# Patient Record
Sex: Male | Born: 1940 | Race: White | Hispanic: No | Marital: Married | Smoking: Former smoker
Health system: Southern US, Community
[De-identification: ages and names within clinical notes are randomized; demographics above are authoritative.]

## PROBLEM LIST (undated history)

## (undated) DIAGNOSIS — I10 Essential (primary) hypertension: Secondary | ICD-10-CM

## (undated) DIAGNOSIS — E559 Vitamin D deficiency, unspecified: Secondary | ICD-10-CM

## (undated) DIAGNOSIS — E785 Hyperlipidemia, unspecified: Secondary | ICD-10-CM

## (undated) DIAGNOSIS — N400 Enlarged prostate without lower urinary tract symptoms: Secondary | ICD-10-CM

## (undated) HISTORY — DX: Benign prostatic hyperplasia without lower urinary tract symptoms: N40.0

## (undated) HISTORY — DX: Hyperlipidemia, unspecified: E78.5

## (undated) HISTORY — DX: Vitamin D deficiency, unspecified: E55.9

## (undated) HISTORY — DX: Essential (primary) hypertension: I10

---

## 1978-01-28 HISTORY — PX: ANAL FISSURE REPAIR: SHX2312

## 2007-12-03 ENCOUNTER — Encounter: Payer: Self-pay | Admitting: Gastroenterology

## 2008-06-09 ENCOUNTER — Ambulatory Visit: Payer: Self-pay | Admitting: Gastroenterology

## 2008-06-22 ENCOUNTER — Ambulatory Visit: Payer: Self-pay | Admitting: Gastroenterology

## 2008-06-22 ENCOUNTER — Encounter: Payer: Self-pay | Admitting: Gastroenterology

## 2008-06-23 ENCOUNTER — Encounter: Payer: Self-pay | Admitting: Gastroenterology

## 2010-01-28 HISTORY — PX: OTHER SURGICAL HISTORY: SHX169

## 2010-01-28 HISTORY — PX: PROSTATE SURGERY: SHX751

## 2010-05-08 LAB — GLUCOSE, CAPILLARY
Glucose-Capillary: 104 mg/dL — ABNORMAL HIGH (ref 70–99)
Glucose-Capillary: 87 mg/dL (ref 70–99)

## 2010-08-17 ENCOUNTER — Encounter (HOSPITAL_COMMUNITY): Payer: Medicare Other

## 2010-08-17 ENCOUNTER — Other Ambulatory Visit: Payer: Self-pay | Admitting: Urology

## 2010-08-17 LAB — BASIC METABOLIC PANEL
BUN: 32 mg/dL — ABNORMAL HIGH (ref 6–23)
CO2: 29 mEq/L (ref 19–32)
Calcium: 9.4 mg/dL (ref 8.4–10.5)
Chloride: 104 mEq/L (ref 96–112)
Creatinine, Ser: 1.54 mg/dL — ABNORMAL HIGH (ref 0.50–1.35)
GFR calc Af Amer: 55 mL/min — ABNORMAL LOW (ref >60)
GFR calc non Af Amer: 45 mL/min — ABNORMAL LOW (ref >60)
Glucose, Bld: 105 mg/dL — ABNORMAL HIGH (ref 70–99)
Potassium: 5.1 mEq/L (ref 3.5–5.1)
Sodium: 137 mEq/L (ref 135–145)

## 2010-08-17 LAB — CBC
HCT: 36.5 % — ABNORMAL LOW (ref 39.0–52.0)
Hemoglobin: 11.8 g/dL — ABNORMAL LOW (ref 13.0–17.0)
MCH: 32.4 pg (ref 26.0–34.0)
MCHC: 32.3 g/dL (ref 30.0–36.0)
MCV: 100.3 fL — ABNORMAL HIGH (ref 78.0–100.0)
Platelets: 262 10*3/uL (ref 150–400)
RBC: 3.64 MIL/uL — ABNORMAL LOW (ref 4.22–5.81)
RDW: 13.3 % (ref 11.5–15.5)
WBC: 7.8 10*3/uL (ref 4.0–10.5)

## 2010-08-17 LAB — SURGICAL PCR SCREEN
MRSA, PCR: POSITIVE — AB
Staphylococcus aureus: POSITIVE — AB

## 2010-08-24 ENCOUNTER — Ambulatory Visit (HOSPITAL_COMMUNITY)
Admission: RE | Admit: 2010-08-24 | Discharge: 2010-08-25 | Disposition: A | Discharge status: Home / Self Care | Payer: Medicare Other | Source: Ambulatory Visit | Attending: Urology | Admitting: Urology

## 2010-08-24 ENCOUNTER — Other Ambulatory Visit: Payer: Self-pay | Admitting: Urology

## 2010-08-24 DIAGNOSIS — N138 Other obstructive and reflux uropathy: Secondary | ICD-10-CM | POA: Insufficient documentation

## 2010-08-24 DIAGNOSIS — N401 Enlarged prostate with lower urinary tract symptoms: Secondary | ICD-10-CM | POA: Insufficient documentation

## 2010-08-24 DIAGNOSIS — Z01810 Encounter for preprocedural cardiovascular examination: Secondary | ICD-10-CM | POA: Insufficient documentation

## 2010-08-24 DIAGNOSIS — Z79899 Other long term (current) drug therapy: Secondary | ICD-10-CM | POA: Insufficient documentation

## 2010-08-24 DIAGNOSIS — E7800 Pure hypercholesterolemia, unspecified: Secondary | ICD-10-CM | POA: Insufficient documentation

## 2010-08-24 DIAGNOSIS — E119 Type 2 diabetes mellitus without complications: Secondary | ICD-10-CM | POA: Insufficient documentation

## 2010-08-24 DIAGNOSIS — Z7982 Long term (current) use of aspirin: Secondary | ICD-10-CM | POA: Insufficient documentation

## 2010-08-24 DIAGNOSIS — Z01812 Encounter for preprocedural laboratory examination: Secondary | ICD-10-CM | POA: Insufficient documentation

## 2010-08-24 DIAGNOSIS — I10 Essential (primary) hypertension: Secondary | ICD-10-CM | POA: Insufficient documentation

## 2010-08-24 LAB — GLUCOSE, CAPILLARY
Glucose-Capillary: 95 mg/dL (ref 70–99)
Glucose-Capillary: 109 mg/dL — ABNORMAL HIGH (ref 70–99)

## 2010-09-02 NOTE — Op Note (Signed)
NAME:  Mario Powers, SLIKER NO.:  [id number]  MEDICAL RECORD NO.:  [id number]  LOCATION:  DAYL                         FACILITY:  [facility]  PHYSICIAN:  Jakelin Taussig C. Vernie Ammons, M.D.  DATE OF BIRTH:  February 11, 1940  DATE OF PROCEDURE:  08/24/2010 DATE OF DISCHARGE:                              OPERATIVE REPORT   DATE OF OPERATION:  08/24/2010  PREOPERATIVE DIAGNOSIS:  Benign prostatic hypertrophy with outlet obstruction.  POSTOPERATIVE DIAGNOSIS:  Benign prostatic hypertrophy with outlet obstruction.  PROCEDURE:  Transurethral resection of the prostate.  SURGEON:  Mychelle Kendra C. Vernie Ammons, M.D.  ANESTHESIA:  General.  SPECIMENS:  Prostate chips to pathology.  ESTIMATED BLOOD LOSS:  Approximately 100 mL.  DRAINS:  A 20-French three-way Foley catheter.  COMPLICATIONS:  None.  INDICATIONS:  Patient is a 70 year old male with bladder outlet obstruction, which resulted in obstructive uropathy with an elevation of his creatinine as well as bilateral hydronephrosis.  A Foley catheter was placed and the bladder was drained overtime with resolution of his hydronephrosis and improvement of his creatinine.  He underwent urodynamics, which revealed outlet obstruction with a strong detrusor contraction and mild detrusor instability, which was felt to be of no clinical significance.  I therefore discussed the treatment options with the patient and he has elected to proceed with transurethral resection of his prostate.  The risks, complications, and alternatives have been discussed.  DESCRIPTION OF OPERATION:  After informed consent, the patient was brought to the major OR, placed on the table, administered general anesthesia then moved to the dorsal lithotomy position.  He received 500 mg of intravenous Cipro.  His genitalia was sterilely prepped and draped and an official time-out was then performed.  A 28-French resectoscope sheath with Timberlake obturator was then passed into the  bladder and the obturator was removed and replaced with the 12 degrees lens and resectoscope element.  The bladder was noted to have 4+ trabeculation.  There was some irritation of the mucosa secondary to his Foley catheter, but no worrisome lesions were found on full and systematic inspection of the bladder.  Ureteral orifices were noted to be of normal position and configuration and well away from the bladder neck.  I photographed the prostate as I withdrew the scope back, which revealed significant obstructing lateral lobes.  Resection was then begun at 6 o'clock position and I resected back from the 6 o'clock position of the bladder neck to the verumontanum.  I then resected the left lobe of the prostate in a counterclockwise fashion from 5 o'clock to 12 o'clock position resecting down to surgical capsule.  The bleeding points were cauterized as they were encountered. I then proceeded to resect the right lobe in an identical fashion.  I then resected apical tissue with care being taken to remain proximal to the verumontanum at all times.  The Microvasive evacuator was then used to remove all prostate chips from the bladder and reinspection of the bladder revealed it was intact with no chips present.  Ureteral orifices were intact as well.  Prostatic urethra was widely patent.  All bleeding points were cauterized and the resectoscope was removed.  The 20-French Foley catheter was then inserted into the  bladder and connected to close system drainage as well as continuous bladder irrigation and the patient was awakened and taken to recovery room in stable and satisfactory condition.  He tolerated the procedure well and there were no intraoperative complications.  He will be observed overnight with anticipation of catheter removal and discharged in the morning.     Dyana Magner C. Vernie Ammons, M.D.     MCO/MEDQ  D:  08/24/2010  T:  08/24/2010  Job:  [id number]  Electronically Signed by Ihor Gully M.D. on 09/02/2010 05:11:53 PM

## 2011-01-29 DEATH — deceased

## 2011-11-28 ENCOUNTER — Encounter: Payer: Self-pay | Admitting: Cardiology

## 2012-03-17 ENCOUNTER — Other Ambulatory Visit: Payer: Self-pay | Admitting: Dermatology

## 2012-04-20 ENCOUNTER — Other Ambulatory Visit: Payer: Self-pay

## 2012-04-20 MED ORDER — ATORVASTATIN CALCIUM 80 MG PO TABS: 80.0000 mg | ORAL_TABLET | Freq: Every day | ORAL | Status: DC

## 2012-04-21 ENCOUNTER — Other Ambulatory Visit: Payer: Self-pay

## 2012-04-21 MED ORDER — ATORVASTATIN CALCIUM 80 MG PO TABS: 80.0000 mg | ORAL_TABLET | Freq: Every day | ORAL | Status: DC

## 2012-05-27 ENCOUNTER — Other Ambulatory Visit: Payer: Self-pay | Admitting: Family Medicine

## 2012-06-19 ENCOUNTER — Other Ambulatory Visit (INDEPENDENT_AMBULATORY_CARE_PROVIDER_SITE_OTHER): Payer: Medicare Other

## 2012-06-19 DIAGNOSIS — Z1212 Encounter for screening for malignant neoplasm of rectum: Secondary | ICD-10-CM

## 2012-07-17 ENCOUNTER — Other Ambulatory Visit: Payer: Self-pay | Admitting: Family Medicine

## 2012-07-20 NOTE — Telephone Encounter (Signed)
LAST LABS 2/14.

## 2012-07-22 ENCOUNTER — Encounter: Payer: Self-pay | Admitting: Family Medicine

## 2012-07-22 ENCOUNTER — Ambulatory Visit (INDEPENDENT_AMBULATORY_CARE_PROVIDER_SITE_OTHER): Payer: Medicare Other | Admitting: Family Medicine

## 2012-07-22 VITALS — BP 118/65 | HR 71 | Temp 97.4°F | Ht 67.5 in | Wt 203.0 lb

## 2012-07-22 DIAGNOSIS — R972 Elevated prostate specific antigen [PSA]: Secondary | ICD-10-CM

## 2012-07-22 DIAGNOSIS — E559 Vitamin D deficiency, unspecified: Secondary | ICD-10-CM

## 2012-07-22 DIAGNOSIS — I10 Essential (primary) hypertension: Secondary | ICD-10-CM

## 2012-07-22 DIAGNOSIS — N400 Enlarged prostate without lower urinary tract symptoms: Secondary | ICD-10-CM

## 2012-07-22 DIAGNOSIS — E785 Hyperlipidemia, unspecified: Secondary | ICD-10-CM

## 2012-07-22 LAB — POCT CBC
MCH, POC: 34.5 pg — AB (ref 27–31.2)
MCV: 98.9 fL — AB (ref 80–97)
POC LYMPH PERCENT: 35.5 %L (ref 10–50)
Platelet Count, POC: 244 10*3/uL (ref 142–424)
RBC: 4.8 M/uL (ref 4.69–6.13)
RDW, POC: 13.2 %
WBC: 6.5 10*3/uL (ref 4.6–10.2)

## 2012-07-22 LAB — PSA: PSA: 2.86 ng/mL (ref <=4.00)

## 2012-07-22 LAB — HEPATIC FUNCTION PANEL
ALT: 18 U/L (ref 0–53)
Albumin: 3.8 g/dL (ref 3.5–5.2)
Alkaline Phosphatase: 77 U/L (ref 39–117)
Total Protein: 6.8 g/dL (ref 6.0–8.3)

## 2012-07-22 LAB — BASIC METABOLIC PANEL WITH GFR
BUN: 18 mg/dL (ref 6–23)
Chloride: 101 mEq/L (ref 96–112)
Creat: 1.3 mg/dL (ref 0.50–1.35)
GFR, Est Non African American: 55 mL/min — ABNORMAL LOW

## 2012-07-22 NOTE — Addendum Note (Signed)
Addended by: Gwenith Daily on: 07/22/2012 10:30 AM   Modules accepted: Orders

## 2012-07-22 NOTE — Addendum Note (Signed)
Addended by: Orma Render F on: 07/22/2012 10:39 AM   Modules accepted: Orders

## 2012-07-22 NOTE — Patient Instructions (Addendum)
Fall precautions discussed Continue current meds and therapeutic lifestyle changes May try Claritin over-the-counter and or Nasacort AQ over-the-counter 1-2 sprays each nostril daily if needed for allergic rhinitis

## 2012-07-22 NOTE — Progress Notes (Addendum)
  Subjective:    Patient ID: Mario Powers, male    DOB: [birth year], 72 y.o.   MRN: [id number]  HPI Patient returns to clinic today for followup of chronic medical problems. These include hypertension hyperlipidemia and BPH. He has been followed by Dr. Vernie Ammons for his obstructive uropathy, which has been resolved with TURP. He has had a history of elevated PSAs and subsequent biopsies have been negative. He also had an elevated creatinine but this has recently come back to a normal range subsequent to TURP. He is doing well staying active and works some in the real estate business but goes to because a lot and does a lot of sailing.   Review of Systems  Constitutional: Negative.   HENT: Positive for sneezing (due to allergies) and postnasal drip (due to allergies).   Eyes: Negative.   Cardiovascular: Negative.   Gastrointestinal: Negative.   Endocrine: Negative.   Genitourinary: Negative.   Musculoskeletal: Negative.   Skin: Negative.   Allergic/Immunologic: Positive for environmental allergies (seasonal).  Neurological: Negative.   Psychiatric/Behavioral: Negative.    There is an irritated seborrheic keratosis on his wrist.dwmf     Objective:   Physical Exam  BP 118/65  Pulse 71  Temp(Src) 97.4 F (36.3 C) (Oral)  Ht 5' 7.5" (1.715 m)  Wt 203 lb (92.08 kg)  BMI 31.31 kg/m2  The patient appeared well nourished and normally developed, alert and oriented to time and place. Speech, behavior and judgement appear normal. Vital signs as documented.  Head exam is unremarkable. No scleral icterus or pallor noted. Some nasal congestion.  Neck is without jugular venous distension, thyromegally, or carotid bruits. Carotid upstrokes are brisk bilaterally. No cervical adenopathy. Lungs are clear anteriorly and posteriorly to auscultation. Normal respiratory effort. No axillary nodes the Cardiac exam reveals regular rate and rhythm at 72 per minute. First and second heart sounds normal.   No murmurs, rubs or gallops.  Abdominal exam reveals normal bowl sounds, no masses, no organomegaly and no aortic enlargement. No inguinal adenopathy. Extremities are nonedematous and both femoral and pedal pulses are normal. Skin without pallor or jaundice.  Warm and dry, without rash. He has an irritated seborrheic keratosis on the left lateral wrist. Neurologic exam reveals normal deep tendon reflexes and normal sensation.         Assessment & Plan:  1. Hyperlipidemia - Hepatic function panel; Standing - NMR Lipoprofile with Lipids; Standing  2. Hypertension - POCT CBC; Standing - BASIC METABOLIC PANEL WITH GFR; Standing  3. BPH (benign prostatic hypertrophy) -PSA  4. Vitamin D deficiency - Vitamin D 25 hydroxy; Standing  5. Elevated PSA  6. Irritated seborrheic keratosis left lateral Wrist -Cryotherapy  Patient Instructions  Fall precautions discussed Continue current meds and therapeutic lifestyle changes May try Claritin over-the-counter and or Nasacort AQ over-the-counter 1-2 sprays each nostril daily if needed for allergic rhinitis

## 2012-07-23 LAB — NMR LIPOPROFILE WITH LIPIDS
HDL Particle Number: 35 umol/L (ref >=30.5)
LDL (calc): 68 mg/dL (ref <100)
LDL Size: 21.3 nm (ref >20.5)
LP-IR Score: 34 (ref <=45)
Large HDL-P: 5.1 umol/L (ref >=4.8)
Large VLDL-P: 1.7 nmol/L (ref <=2.7)
Small LDL Particle Number: 589 nmol/L — ABNORMAL HIGH (ref <=527)

## 2012-08-12 ENCOUNTER — Telehealth: Payer: Self-pay | Admitting: *Deleted

## 2012-08-12 NOTE — Telephone Encounter (Signed)
Message copied by Bearl Mulberry on Wed Aug 12, 2012  6:13 PM ------      Message from: Ernestina Penna      Created: Thu Jul 23, 2012  1:05 PM       PSA is 2.86      Electrolytes blood sugar and renal were all within normal limits.      All liver function tests were within normal limit      Vitamin D is excellent at 55      On advanced lipid testing the total LDL particle number is slightly elevated at 1121. The goal would be less than 1000. Triglycerides are good at 69.--- Continue current medication and aggressive therapeutic lifestyle changes to get cholesterol under better control. ------

## 2012-08-12 NOTE — Telephone Encounter (Signed)
Pt notified of results

## 2012-09-23 ENCOUNTER — Other Ambulatory Visit: Payer: Self-pay | Admitting: Family Medicine

## 2012-10-14 ENCOUNTER — Other Ambulatory Visit: Payer: Self-pay | Admitting: Family Medicine

## 2012-12-03 ENCOUNTER — Other Ambulatory Visit: Payer: Self-pay

## 2013-01-13 ENCOUNTER — Ambulatory Visit (INDEPENDENT_AMBULATORY_CARE_PROVIDER_SITE_OTHER): Payer: Medicare Other | Admitting: Family Medicine

## 2013-01-13 ENCOUNTER — Encounter: Payer: Self-pay | Admitting: Family Medicine

## 2013-01-13 VITALS — BP 122/66 | HR 66 | Temp 97.8°F | Ht 67.5 in | Wt 202.0 lb

## 2013-01-13 DIAGNOSIS — R739 Hyperglycemia, unspecified: Secondary | ICD-10-CM

## 2013-01-13 DIAGNOSIS — Z860100 Personal history of colon polyps, unspecified: Secondary | ICD-10-CM | POA: Insufficient documentation

## 2013-01-13 DIAGNOSIS — R7309 Other abnormal glucose: Secondary | ICD-10-CM

## 2013-01-13 DIAGNOSIS — E785 Hyperlipidemia, unspecified: Secondary | ICD-10-CM

## 2013-01-13 DIAGNOSIS — E559 Vitamin D deficiency, unspecified: Secondary | ICD-10-CM

## 2013-01-13 DIAGNOSIS — I10 Essential (primary) hypertension: Secondary | ICD-10-CM

## 2013-01-13 DIAGNOSIS — Z23 Encounter for immunization: Secondary | ICD-10-CM

## 2013-01-13 DIAGNOSIS — N400 Enlarged prostate without lower urinary tract symptoms: Secondary | ICD-10-CM

## 2013-01-13 DIAGNOSIS — R972 Elevated prostate specific antigen [PSA]: Secondary | ICD-10-CM

## 2013-01-13 LAB — POCT CBC
Granulocyte percent: 65.4 %G (ref 37–80)
HCT, POC: 49.3 % (ref 43.5–53.7)
Hemoglobin: 16.1 g/dL (ref 14.1–18.1)
MPV: 8.2 fL (ref 0–99.8)
POC Granulocyte: 4.4 (ref 2–6.9)
RBC: 4.9 M/uL (ref 4.69–6.13)

## 2013-01-13 NOTE — Progress Notes (Signed)
Subjective:    Patient ID: Mario Powers, male    DOB: 08-10-1940, 72 y.o.   MRN: [id number]  HPI Pt here for follow up and management of chronic medical problems.patient here today for routine followupand his annual exam. His health maintenance parameters appear to be up-to-date other than receiving the Prevnar vaccine.this patient has a past history of elevated PSAs with multiple biopsies which have been negative. The patient also has a history of an elevated creatinine in the past and this was found to be primarily deep to an enlarged prostate for which he had a TURP. He has a history of colon polyps and his colonoscopies need to be done every 5 years and this will be coming up next spring.       Patient Active Problem List   Diagnosis Date Noted  . Hyperlipidemia 07/22/2012  . Hypertension 07/22/2012  . BPH (benign prostatic hypertrophy) 07/22/2012  . Vitamin D deficiency 07/22/2012   Outpatient Encounter Prescriptions as of 01/13/2013  Medication Sig  . aspirin 81 MG tablet Take 81 mg by mouth daily.  Marland Kitchen atorvastatin (LIPITOR) 80 MG tablet Take 80 mg by mouth daily. As directed  . Cholecalciferol (VITAMIN D) 2000 UNITS CAPS Take 1 capsule by mouth daily.  . fish oil-omega-3 fatty acids 1000 MG capsule Take 2 g by mouth daily.  Marland Kitchen losartan-hydrochlorothiazide (HYZAAR) 100-12.5 MG per tablet TAKE 1 TABLET BY MOUTH ONCE A DAY as directed  . Multiple Vitamin (MULTIVITAMIN) tablet Take 1 tablet by mouth daily.  . niacin (NIASPAN) 1000 MG CR tablet TAKE 3 TABLETS BY MOUTH AT BEDTIME  . pioglitazone (ACTOS) 30 MG tablet TAKE 1 TABLET BY MOUTH EVERY DAY  . [DISCONTINUED] atorvastatin (LIPITOR) 80 MG tablet Take 1 tablet (80 mg total) by mouth daily.  . [DISCONTINUED] losartan-hydrochlorothiazide (HYZAAR) 100-12.5 MG per tablet TAKE 1 TABLET BY MOUTH ONCE A DAY    Review of Systems  Constitutional: Negative.   HENT: Negative.   Eyes: Negative.   Respiratory: Negative.     Cardiovascular: Negative.   Gastrointestinal: Negative.   Endocrine: Negative.   Genitourinary: Negative.   Musculoskeletal: Negative.   Skin: Negative.   Allergic/Immunologic: Negative.   Neurological: Negative.   Hematological: Negative.   Psychiatric/Behavioral: Negative.        Objective:   Physical Exam  Nursing note and vitals reviewed. Constitutional: He is oriented to person, place, and time. He appears well-developed and well-nourished. No distress.  HENT:  Head: Normocephalic and atraumatic.  Right Ear: External ear normal.  Left Ear: External ear normal.  Nose: Nose normal.  Mouth/Throat: Oropharynx is clear and moist. No oropharyngeal exudate.  Eyes: Conjunctivae and EOM are normal. Pupils are equal, round, and reactive to light. Right eye exhibits no discharge. Left eye exhibits no discharge. No scleral icterus.  Neck: Normal range of motion. Neck supple. No tracheal deviation present. No thyromegaly present.  No carotid bruits  Cardiovascular: Normal rate, regular rhythm, normal heart sounds and intact distal pulses.  Exam reveals no gallop and no friction rub.   No murmur heard. 72 per minute  Pulmonary/Chest: Effort normal and breath sounds normal. No respiratory distress. He has no wheezes. He has no rales. He exhibits no tenderness.  Abdominal: Soft. Bowel sounds are normal. He exhibits no mass. There is no tenderness. There is no rebound and no guarding.  Genitourinary: Rectum normal and penis normal. No penile tenderness.  Prostate was enlarged and smooth without any lumps or masses. There were no  rectal masses. External genitalia were normal. There were no inguinal nodes.  Musculoskeletal: Normal range of motion. He exhibits no edema and no tenderness.  Lymphadenopathy:    He has no cervical adenopathy.  Neurological: He is alert and oriented to person, place, and time. He has normal reflexes. No cranial nerve deficit.  Skin: Skin is warm and dry. No rash  noted. No erythema. No pallor.  Psychiatric: He has a normal mood and affect. His behavior is normal. Judgment and thought content normal.   BP 122/66  Pulse 66  Temp(Src) 97.8 F (36.6 C) (Oral)  Ht 5' 7.5" (1.715 m)  Wt 202 lb (91.627 kg)  BMI 31.15 kg/m2        Assessment & Plan:  1. Vitamin D deficiency - POCT CBC - Vit D  25 hydroxy (rtn osteoporosis monitoring)  2. Hypertension - POCT CBC - BMP8+EGFR - Hepatic function panel  3. Hyperlipidemia - POCT CBC - NMR, lipoprofile  4. BPH (benign prostatic hypertrophy) - POCT CBC - PSA, total and free  5. Elevated blood sugar - POCT glycosylated hemoglobin (Hb A1C)  6. BPH with elevated PSA  7. Personal history of colonic polyps -colonoscopy due in May 2015  8.annual exam  Meds ordered this encounter  Medications  . atorvastatin (LIPITOR) 80 MG tablet    Sig: Take 80 mg by mouth daily. As directed  . losartan-hydrochlorothiazide (HYZAAR) 100-12.5 MG per tablet    Sig: TAKE 1 TABLET BY MOUTH ONCE A DAY as directed   Patient Instructions  Continue current medications. Continue good therapeutic lifestyle changes which include good diet and exercise. Fall precautions discussed with patient. Schedule your flu vaccine if you haven't had it yet If you are over 49 years old - you may need Prevnar 13 or the adult Pneumonia vaccine. Prevnar shot that [place] Today may make your arm is sore for 2-3 days. Remember that your colonoscopy will be due again this may or June   Nyra Capes MD

## 2013-01-13 NOTE — Addendum Note (Signed)
Addended by: Magdalene River on: 01/13/2013 10:21 AM   Modules accepted: Orders

## 2013-01-13 NOTE — Patient Instructions (Addendum)
Continue current medications. Continue good therapeutic lifestyle changes which include good diet and exercise. Fall precautions discussed with patient. Schedule your flu vaccine if you haven't had it yet If you are over 71 years old - you may need Prevnar 13 or the adult Pneumonia vaccine. Prevnar shot that [place] Today may make your arm is sore for 2-3 days. Remember that your colonoscopy will be due again this may or June

## 2013-01-15 LAB — BMP8+EGFR
BUN/Creatinine Ratio: 12 (ref 10–22)
BUN: 14 mg/dL (ref 8–27)
CO2: 26 mmol/L (ref 18–29)
Calcium: 9.1 mg/dL (ref 8.6–10.2)
Chloride: 97 mmol/L (ref 97–108)
Creatinine, Ser: 1.15 mg/dL (ref 0.76–1.27)
GFR calc Af Amer: 74 mL/min/{1.73_m2} (ref >59)
GFR calc non Af Amer: 64 mL/min/{1.73_m2} (ref >59)
Glucose: 92 mg/dL (ref 65–99)
Potassium: 4.5 mmol/L (ref 3.5–5.2)
Sodium: 137 mmol/L (ref 134–144)

## 2013-01-15 LAB — NMR, LIPOPROFILE
Cholesterol: 121 mg/dL (ref <200)
HDL Cholesterol by NMR: 54 mg/dL (ref >=40)
HDL Particle Number: 32.8 umol/L (ref >=30.5)
LDL Particle Number: 637 nmol/L (ref <1000)
LDL Size: 21.3 nm (ref >20.5)
LDLC SERPL CALC-MCNC: 50 mg/dL (ref <100)
LP-IR Score: <25 (ref <=45)
Small LDL Particle Number: <90 nmol/L (ref <=527)
Triglycerides by NMR: 87 mg/dL (ref <150)

## 2013-01-15 LAB — VITAMIN D 25 HYDROXY (VIT D DEFICIENCY, FRACTURES): Vit D, 25-Hydroxy: 43 ng/mL (ref 30.0–100.0)

## 2013-01-15 LAB — PSA, TOTAL AND FREE
PSA, Free Pct: 19 %
PSA, Free: 0.95 ng/mL
PSA: 5 ng/mL — ABNORMAL HIGH (ref 0.0–4.0)

## 2013-01-15 LAB — SPECIMEN STATUS REPORT

## 2013-01-15 LAB — HEPATIC FUNCTION PANEL
ALT: 22 IU/L (ref 0–44)
AST: 27 IU/L (ref 0–40)
Albumin: 4.1 g/dL (ref 3.5–4.8)
Alkaline Phosphatase: 76 IU/L (ref 39–117)
Bilirubin, Direct: 0.3 mg/dL (ref 0.00–0.40)
Total Bilirubin: 1 mg/dL (ref 0.0–1.2)
Total Protein: 6.6 g/dL (ref 6.0–8.5)

## 2013-01-18 ENCOUNTER — Other Ambulatory Visit: Payer: Self-pay | Admitting: Family Medicine

## 2013-02-01 ENCOUNTER — Other Ambulatory Visit: Payer: Self-pay | Admitting: Family Medicine

## 2013-02-17 ENCOUNTER — Other Ambulatory Visit: Payer: Self-pay | Admitting: Dermatology

## 2013-02-23 ENCOUNTER — Encounter: Payer: Self-pay | Admitting: Family Medicine

## 2013-03-03 ENCOUNTER — Other Ambulatory Visit (INDEPENDENT_AMBULATORY_CARE_PROVIDER_SITE_OTHER): Payer: Medicare Other

## 2013-03-03 DIAGNOSIS — R7989 Other specified abnormal findings of blood chemistry: Secondary | ICD-10-CM

## 2013-03-03 LAB — POCT URINALYSIS DIPSTICK
BILIRUBIN UA: NEGATIVE
GLUCOSE UA: NEGATIVE
Ketones, UA: NEGATIVE
NITRITE UA: NEGATIVE
Spec Grav, UA: <=1.005
Urobilinogen, UA: NEGATIVE
pH, UA: 7.5

## 2013-03-03 LAB — POCT UA - MICROSCOPIC ONLY
Casts, Ur, LPF, POC: NEGATIVE
Crystals, Ur, HPF, POC: NEGATIVE
Mucus, UA: NEGATIVE
Yeast, UA: NEGATIVE

## 2013-03-03 NOTE — Progress Notes (Signed)
Patient here for labs only. 

## 2013-03-04 ENCOUNTER — Other Ambulatory Visit: Payer: Self-pay | Admitting: *Deleted

## 2013-03-04 LAB — PSA, TOTAL AND FREE
PSA FREE PCT: 18.5 %
PSA, Free: 0.76 ng/mL
PSA: 4.1 ng/mL — ABNORMAL HIGH (ref 0.0–4.0)

## 2013-03-04 MED ORDER — SULFAMETHOXAZOLE-TMP DS 800-160 MG PO TABS: 1.0000 | ORAL_TABLET | Freq: Two times a day (BID) | ORAL | Status: DC

## 2013-04-19 ENCOUNTER — Other Ambulatory Visit: Payer: Self-pay | Admitting: Family Medicine

## 2013-05-06 ENCOUNTER — Encounter: Payer: Self-pay | Admitting: Gastroenterology

## 2013-05-11 ENCOUNTER — Ambulatory Visit (INDEPENDENT_AMBULATORY_CARE_PROVIDER_SITE_OTHER): Payer: Medicare Other | Admitting: Family Medicine

## 2013-05-11 ENCOUNTER — Encounter: Payer: Self-pay | Admitting: Family Medicine

## 2013-05-11 ENCOUNTER — Ambulatory Visit (INDEPENDENT_AMBULATORY_CARE_PROVIDER_SITE_OTHER): Payer: Medicare Other

## 2013-05-11 VITALS — BP 133/79 | HR 87 | Temp 97.0°F | Ht 67.5 in | Wt 203.0 lb

## 2013-05-11 DIAGNOSIS — R739 Hyperglycemia, unspecified: Secondary | ICD-10-CM

## 2013-05-11 DIAGNOSIS — M79672 Pain in left foot: Secondary | ICD-10-CM

## 2013-05-11 DIAGNOSIS — M79609 Pain in unspecified limb: Secondary | ICD-10-CM

## 2013-05-11 DIAGNOSIS — R7309 Other abnormal glucose: Secondary | ICD-10-CM

## 2013-05-11 DIAGNOSIS — N400 Enlarged prostate without lower urinary tract symptoms: Secondary | ICD-10-CM

## 2013-05-11 DIAGNOSIS — E559 Vitamin D deficiency, unspecified: Secondary | ICD-10-CM

## 2013-05-11 DIAGNOSIS — E8881 Metabolic syndrome: Secondary | ICD-10-CM

## 2013-05-11 DIAGNOSIS — E785 Hyperlipidemia, unspecified: Secondary | ICD-10-CM

## 2013-05-11 DIAGNOSIS — N390 Urinary tract infection, site not specified: Secondary | ICD-10-CM

## 2013-05-11 LAB — POCT CBC
Granulocyte percent: 52.9 %G (ref 37–80)
HCT, POC: 44.9 % (ref 43.5–53.7)
HEMOGLOBIN: 14.7 g/dL (ref 14.1–18.1)
LYMPH, POC: 2.4 (ref 0.6–3.4)
MCH: 32.8 pg — AB (ref 27–31.2)
MCHC: 32.7 g/dL (ref 31.8–35.4)
MCV: 100 fL — AB (ref 80–97)
MPV: 7.6 fL (ref 0–99.8)
POC Granulocyte: 3.1 (ref 2–6.9)
POC LYMPH PERCENT: 40.8 %L (ref 10–50)
Platelet Count, POC: 237 10*3/uL (ref 142–424)
RBC: 4.5 M/uL — AB (ref 4.69–6.13)
RDW, POC: 13.3 %
WBC: 5.8 10*3/uL (ref 4.6–10.2)

## 2013-05-11 LAB — POCT GLYCOSYLATED HEMOGLOBIN (HGB A1C): Hemoglobin A1C: 5.2

## 2013-05-11 LAB — POCT URINALYSIS DIPSTICK
BILIRUBIN UA: NEGATIVE
Glucose, UA: NEGATIVE
Ketones, UA: NEGATIVE
NITRITE UA: NEGATIVE
Protein, UA: NEGATIVE
Spec Grav, UA: 1.01
Urobilinogen, UA: NEGATIVE
pH, UA: 7

## 2013-05-11 LAB — POCT UA - MICROSCOPIC ONLY
CASTS, UR, LPF, POC: NEGATIVE
Crystals, Ur, HPF, POC: NEGATIVE
MUCUS UA: NEGATIVE
Yeast, UA: NEGATIVE

## 2013-05-11 NOTE — Addendum Note (Signed)
Addended by: Earlene Plater on: 05/11/2013 11:03 AM   Modules accepted: Orders

## 2013-05-11 NOTE — Progress Notes (Signed)
Subjective:    Patient ID: Mario Powers, male    DOB: 1940/03/10, 73 y.o.   MRN: [id number]  HPI Pt here for follow up and management of chronic medical problems. The patient complained of some right hand numbness and this was worse at nighttime while he was taking the sulfa. He completed a sore throat this numbness went away. He also complains of some left heel pain and coincidentally at the same time some right thigh pain. He is never had problems with this before. He is due to get a colonoscopy this year in May and has only been contacted by the gastroenterologist. He will also get a followup urinalysis and PSA following the recent treatment for a prostatitis.        Patient Active Problem List   Diagnosis Date Noted  . Metabolic syndrome 63/87/5643  . Elevated blood sugar 01/13/2013  . Personal history of colonic polyps 01/13/2013  . Hyperlipidemia 07/22/2012  . Hypertension 07/22/2012  . BPH (benign prostatic hypertrophy) 07/22/2012  . Vitamin D deficiency 07/22/2012   Outpatient Encounter Prescriptions as of 05/11/2013  Medication Sig  . aspirin 81 MG tablet Take 81 mg by mouth daily.  Marland Kitchen atorvastatin (LIPITOR) 80 MG tablet Take 80 mg by mouth daily. As directed  . Cholecalciferol (VITAMIN D) 2000 UNITS CAPS Take 1 capsule by mouth daily.  . fish oil-omega-3 fatty acids 1000 MG capsule Take 2 g by mouth daily.  Marland Kitchen losartan-hydrochlorothiazide (HYZAAR) 100-12.5 MG per tablet TAKE 1 TABLET BY MOUTH ONCE A DAY as directed  . Multiple Vitamin (MULTIVITAMIN) tablet Take 1 tablet by mouth daily.  . pioglitazone (ACTOS) 30 MG tablet TAKE 1 TABLET BY MOUTH EVERY DAY  . niacin (NIASPAN) 1000 MG CR tablet TAKE 3 TABLETS BY MOUTH AT BEDTIME  . [DISCONTINUED] sulfamethoxazole-trimethoprim (BACTRIM DS) 800-160 MG per tablet Take 1 tablet by mouth 2 (two) times daily.    Review of Systems  Constitutional: Negative.   HENT: Negative.   Eyes: Negative.   Respiratory: Negative.     Cardiovascular: Negative.   Gastrointestinal: Negative.   Endocrine: Negative.   Genitourinary: Negative.   Musculoskeletal: Positive for myalgias (right upper thigh pains off/on     AND had some right hand numbness (while on Bactrim)).       Left heel pain x 3 mos  Skin: Negative.   Allergic/Immunologic: Negative.   Neurological: Negative.   Hematological: Negative.   Psychiatric/Behavioral: Negative.        Objective:   Physical Exam  Nursing note and vitals reviewed. Constitutional: He is oriented to person, place, and time. He appears well-developed and well-nourished. No distress.  Alert and cooperative  HENT:  Head: Normocephalic and atraumatic.  Right Ear: External ear normal.  Left Ear: External ear normal.  Nose: Nose normal.  Mouth/Throat: Oropharynx is clear and moist. No oropharyngeal exudate.  Eyes: Conjunctivae and EOM are normal. Pupils are equal, round, and reactive to light. Right eye exhibits no discharge. Left eye exhibits no discharge. No scleral icterus.  Neck: Normal range of motion. Neck supple. No thyromegaly present.  No carotid bruits  Cardiovascular: Normal rate, regular rhythm, normal heart sounds and intact distal pulses.  Exam reveals no friction rub.   No murmur heard. Pulmonary/Chest: Effort normal and breath sounds normal. He has no wheezes. He has no rales.  No axillary nodes  Abdominal: Soft. Bowel sounds are normal. He exhibits no distension and no mass. There is no tenderness. There is no rebound and  no guarding.  No inguinal nodes  Musculoskeletal: Normal range of motion. He exhibits no edema and no tenderness.  Tender to palpation left plantar surface left heel  Neurological: He is alert and oriented to person, place, and time. He has normal reflexes.  The patient was alert cooperative and intact mentally  Skin: Skin is warm and dry. No rash noted. No erythema.  Psychiatric: He has a normal mood and affect. His behavior is normal.  Judgment and thought content normal.   BP 133/79  Pulse 87  Temp(Src) 97 F (36.1 C) (Oral)  Ht 5' 7.5" (1.715 m)  Wt 203 lb (92.08 kg)  BMI 31.31 kg/m2  [facility] reading (PRIMARY) by  Dr.Darienne Belleau-left heel  --heel spur and calcific tendinitis of the Achilles tendon                                     Assessment & Plan:  1. BPH (benign prostatic hypertrophy) - POCT CBC - PSA, total and free - POCT UA - Microscopic Only - POCT urinalysis dipstick  2. Hyperlipidemia - POCT CBC - BMP8+EGFR - Hepatic function panel - NMR, lipoprofile  3. Vitamin D deficiency - POCT CBC - Vit D  25 hydroxy (rtn osteoporosis monitoring)  4. Elevated blood sugar - POCT CBC - POCT glycosylated hemoglobin (Hb A1C)  5. Metabolic syndrome  6. Pain of left heel - DG Foot Complete Left; Future  7. Prostatitis -Improved  Patient Instructions                       Medicare Annual Wellness Visit  [facility] and the medical providers at [facility] strive to bring you the best medical care.  In doing so we not only want to address your current medical conditions and concerns but also to detect new conditions early and prevent illness, disease and health-related problems.    Medicare offers a yearly Wellness Visit which allows our clinical staff to assess your need for preventative services including immunizations, lifestyle education, counseling to decrease risk of preventable diseases and screening for fall risk and other medical concerns.    This visit is provided free of charge (no copay) for all Medicare recipients. The clinical pharmacists at [facility] have begun to conduct these Wellness Visits which will also include a thorough review of all your medications.    As you primary medical provider recommend that you make an appointment for your Annual Wellness Visit if you have not done so already this year.  You may set up this appointment before you  leave today or you may call back ([phone]) and schedule an appointment.  Please make sure when you call that you mention that you are scheduling your Annual Wellness Visit with the clinical pharmacist so that the appointment may be made for the proper length of time.     Continue current medications. Continue good therapeutic lifestyle changes which include good diet and exercise. Fall precautions discussed with patient. If an FOBT was given today- please return it to our front desk. If you are over 37 years old - you may need Prevnar 38 or the adult Pneumonia vaccine.  Continue to drink plenty of fluids Purchase a heel cup for shoe inserts Call you with the results of the lab work once those results are available Return the FOBT   Arrie Senate MD

## 2013-05-11 NOTE — Patient Instructions (Addendum)
Medicare Annual Wellness Visit  [facility] and the medical providers at [facility] strive to bring you the best medical care.  In doing so we not only want to address your current medical conditions and concerns but also to detect new conditions early and prevent illness, disease and health-related problems.    Medicare offers a yearly Wellness Visit which allows our clinical staff to assess your need for preventative services including immunizations, lifestyle education, counseling to decrease risk of preventable diseases and screening for fall risk and other medical concerns.    This visit is provided free of charge (no copay) for all Medicare recipients. The clinical pharmacists at [facility] have begun to conduct these Wellness Visits which will also include a thorough review of all your medications.    As you primary medical provider recommend that you make an appointment for your Annual Wellness Visit if you have not done so already this year.  You may set up this appointment before you leave today or you may call back ([phone]) and schedule an appointment.  Please make sure when you call that you mention that you are scheduling your Annual Wellness Visit with the clinical pharmacist so that the appointment may be made for the proper length of time.     Continue current medications. Continue good therapeutic lifestyle changes which include good diet and exercise. Fall precautions discussed with patient. If an FOBT was given today- please return it to our front desk. If you are over 67 years old - you may need Prevnar 46 or the adult Pneumonia vaccine.  Continue to drink plenty of fluids Purchase a heel cup for shoe inserts Call you with the results of the lab work once those results are available Return the FOBT

## 2013-05-13 ENCOUNTER — Other Ambulatory Visit: Payer: Self-pay | Admitting: *Deleted

## 2013-05-13 LAB — URINE CULTURE

## 2013-05-13 MED ORDER — DOXYCYCLINE HYCLATE 100 MG PO CAPS: 100.0000 mg | ORAL_CAPSULE | Freq: Two times a day (BID) | ORAL | Status: DC

## 2013-05-14 LAB — NMR, LIPOPROFILE
Cholesterol: 130 mg/dL (ref <200)
HDL CHOLESTEROL BY NMR: 51 mg/dL (ref >=40)
HDL PARTICLE NUMBER: 32.9 umol/L (ref >=30.5)
LDL Particle Number: 783 nmol/L (ref <1000)
LDL SIZE: 21.2 nm (ref >20.5)
LDLC SERPL CALC-MCNC: 62 mg/dL (ref <100)
LP-IR SCORE: 49 — AB (ref <=45)
Small LDL Particle Number: 387 nmol/L (ref <=527)
Triglycerides by NMR: 86 mg/dL (ref <150)

## 2013-05-14 LAB — BMP8+EGFR
BUN / CREAT RATIO: 15 (ref 10–22)
BUN: 17 mg/dL (ref 8–27)
CO2: 24 mmol/L (ref 18–29)
CREATININE: 1.1 mg/dL (ref 0.76–1.27)
Calcium: 9.1 mg/dL (ref 8.6–10.2)
Chloride: 99 mmol/L (ref 97–108)
GFR calc non Af Amer: 67 mL/min/{1.73_m2} (ref >59)
GFR, EST AFRICAN AMERICAN: 78 mL/min/{1.73_m2} (ref >59)
Glucose: 101 mg/dL — ABNORMAL HIGH (ref 65–99)
Potassium: 4 mmol/L (ref 3.5–5.2)
SODIUM: 139 mmol/L (ref 134–144)

## 2013-05-14 LAB — HEPATIC FUNCTION PANEL
ALBUMIN: 4 g/dL (ref 3.5–4.8)
ALK PHOS: 71 IU/L (ref 39–117)
ALT: 23 IU/L (ref 0–44)
AST: 30 IU/L (ref 0–40)
BILIRUBIN DIRECT: 0.28 mg/dL (ref 0.00–0.40)
BILIRUBIN TOTAL: 1.2 mg/dL (ref 0.0–1.2)
Total Protein: 6.3 g/dL (ref 6.0–8.5)

## 2013-05-14 LAB — PSA, TOTAL AND FREE
PSA, Free Pct: 20.2 %
PSA, Free: 0.87 ng/mL
PSA: 4.3 ng/mL — ABNORMAL HIGH (ref 0.0–4.0)

## 2013-05-14 LAB — VITAMIN D 25 HYDROXY (VIT D DEFICIENCY, FRACTURES): Vit D, 25-Hydroxy: 43.3 ng/mL (ref 30.0–100.0)

## 2013-05-31 ENCOUNTER — Other Ambulatory Visit (INDEPENDENT_AMBULATORY_CARE_PROVIDER_SITE_OTHER): Payer: Medicare Other

## 2013-05-31 ENCOUNTER — Telehealth: Payer: Self-pay | Admitting: Family Medicine

## 2013-05-31 DIAGNOSIS — N390 Urinary tract infection, site not specified: Secondary | ICD-10-CM

## 2013-05-31 DIAGNOSIS — M7730 Calcaneal spur, unspecified foot: Secondary | ICD-10-CM

## 2013-05-31 LAB — POCT URINALYSIS DIPSTICK
BILIRUBIN UA: NEGATIVE
Glucose, UA: NEGATIVE
KETONES UA: NEGATIVE
Leukocytes, UA: NEGATIVE
Nitrite, UA: NEGATIVE
PH UA: 5
Protein, UA: NEGATIVE
Spec Grav, UA: 1.02
Urobilinogen, UA: NEGATIVE

## 2013-05-31 LAB — POCT UA - MICROSCOPIC ONLY
CASTS, UR, LPF, POC: NEGATIVE
CRYSTALS, UR, HPF, POC: NEGATIVE
Epithelial cells, urine per micros: NEGATIVE
MUCUS UA: NEGATIVE
WBC, Ur, HPF, POC: NEGATIVE
Yeast, UA: NEGATIVE

## 2013-06-01 NOTE — Telephone Encounter (Signed)
Please refer this patient to [facility] and Dr. Doran Durand--- please get appointment as soon as possible

## 2013-06-01 NOTE — Telephone Encounter (Signed)
Who would you like ref to go to? [facility] address

## 2013-06-12 ENCOUNTER — Other Ambulatory Visit: Payer: Self-pay | Admitting: Family Medicine

## 2013-06-14 ENCOUNTER — Other Ambulatory Visit: Payer: Self-pay | Admitting: Family Medicine

## 2013-07-19 ENCOUNTER — Other Ambulatory Visit: Payer: Self-pay | Admitting: Family Medicine

## 2013-09-16 ENCOUNTER — Ambulatory Visit (INDEPENDENT_AMBULATORY_CARE_PROVIDER_SITE_OTHER): Payer: Medicare Other | Admitting: Family Medicine

## 2013-09-16 ENCOUNTER — Encounter: Payer: Self-pay | Admitting: Family Medicine

## 2013-09-16 VITALS — BP 123/72 | HR 69 | Temp 97.2°F | Ht 67.5 in | Wt 200.0 lb

## 2013-09-16 DIAGNOSIS — E8881 Metabolic syndrome: Secondary | ICD-10-CM

## 2013-09-16 DIAGNOSIS — E785 Hyperlipidemia, unspecified: Secondary | ICD-10-CM

## 2013-09-16 DIAGNOSIS — R972 Elevated prostate specific antigen [PSA]: Secondary | ICD-10-CM

## 2013-09-16 DIAGNOSIS — R739 Hyperglycemia, unspecified: Secondary | ICD-10-CM

## 2013-09-16 DIAGNOSIS — L989 Disorder of the skin and subcutaneous tissue, unspecified: Secondary | ICD-10-CM

## 2013-09-16 DIAGNOSIS — I10 Essential (primary) hypertension: Secondary | ICD-10-CM

## 2013-09-16 DIAGNOSIS — N400 Enlarged prostate without lower urinary tract symptoms: Secondary | ICD-10-CM

## 2013-09-16 DIAGNOSIS — E559 Vitamin D deficiency, unspecified: Secondary | ICD-10-CM

## 2013-09-16 DIAGNOSIS — R7309 Other abnormal glucose: Secondary | ICD-10-CM

## 2013-09-16 LAB — POCT CBC
Granulocyte percent: 56.2 %G (ref 37–80)
HCT, POC: 47.6 % (ref 43.5–53.7)
Hemoglobin: 15.8 g/dL (ref 14.1–18.1)
LYMPH, POC: 2.3 (ref 0.6–3.4)
MCH: 33.5 pg — AB (ref 27–31.2)
MCHC: 33.3 g/dL (ref 31.8–35.4)
MCV: 100.9 fL — AB (ref 80–97)
MPV: 7.4 fL (ref 0–99.8)
PLATELET COUNT, POC: 250 10*3/uL (ref 142–424)
POC Granulocyte: 3.6 (ref 2–6.9)
POC LYMPH %: 35.9 % (ref 10–50)
RBC: 4.7 M/uL (ref 4.69–6.13)
RDW, POC: 13.2 %
WBC: 6.4 10*3/uL (ref 4.6–10.2)

## 2013-09-16 LAB — POCT GLYCOSYLATED HEMOGLOBIN (HGB A1C)

## 2013-09-16 NOTE — Patient Instructions (Signed)
Medicare Annual Wellness Visit  [place] and the medical providers at [facility] strive to bring you the best medical care.  In doing so we not only want to address your current medical conditions and concerns but also to detect new conditions early and prevent illness, disease and health-related problems.    Medicare offers a yearly Wellness Visit which allows our clinical staff to assess your need for preventative services including immunizations, lifestyle education, counseling to decrease risk of preventable diseases and screening for fall risk and other medical concerns.    This visit is provided free of charge (no copay) for all Medicare recipients. The clinical pharmacists at [facility] have begun to conduct these Wellness Visits which will also include a thorough review of all your medications.    As you primary medical provider recommend that you make an appointment for your Annual Wellness Visit if you have not done so already this year.  You may set up this appointment before you leave today or you may call back ([phone]) and schedule an appointment.  Please make sure when you call that you mention that you are scheduling your Annual Wellness Visit with the clinical pharmacist so that the appointment may be made for the proper length of time.     Continue current medications. Continue good therapeutic lifestyle changes which include good diet and exercise. Fall precautions discussed with patient. If an FOBT was given today- please return it to our front desk. If you are over 50 years old - you may need Prevnar 13 or the adult Pneumonia vaccine.  Flu Shots will be available at our office starting mid- September. Please call and schedule a FLU CLINIC APPOINTMENT.    

## 2013-09-16 NOTE — Progress Notes (Signed)
Subjective:    Patient ID: Mario Powers, male    DOB: October 30, 1940, 73 y.o.   MRN: [id number]  HPI Pt here for follow up and management of chronic medical problems. The patient does have a skin lesion on his left shoulder which she would like to have checked. He is also going for his colonoscopy in a couple weeks. We have had periodic problems with his PSA in this is something that we will continue to check ongoing. He would get his routine lab work today. Sizes regularly and is active without any specific complaints         Patient Active Problem List   Diagnosis Date Noted  . Metabolic syndrome 33/00/7622  . Elevated blood sugar 01/13/2013  . Personal history of colonic polyps 01/13/2013  . Hyperlipidemia 07/22/2012  . Hypertension 07/22/2012  . BPH (benign prostatic hypertrophy) 07/22/2012  . Vitamin D deficiency 07/22/2012   Outpatient Encounter Prescriptions as of 09/16/2013  Medication Sig  . aspirin 81 MG tablet Take 81 mg by mouth daily.  Marland Kitchen atorvastatin (LIPITOR) 80 MG tablet Take 80 mg by mouth daily. As directed  . Cholecalciferol (VITAMIN D) 2000 UNITS CAPS Take 1 capsule by mouth daily.  . fish oil-omega-3 fatty acids 1000 MG capsule Take 2 g by mouth daily.  Marland Kitchen losartan-hydrochlorothiazide (HYZAAR) 100-12.5 MG per tablet TAKE 1 TABLET BY MOUTH ONCE A DAY as directed  . Multiple Vitamin (MULTIVITAMIN) tablet Take 1 tablet by mouth daily.  . niacin (NIASPAN) 1000 MG CR tablet TAKE 3 TABLETS BY MOUTH AT BEDTIME  . pioglitazone (ACTOS) 30 MG tablet TAKE 1 TABLET BY MOUTH EVERY DAY  . [DISCONTINUED] atorvastatin (LIPITOR) 80 MG tablet TAKE 1 TABLET BY MOUTH DAILY.  . [DISCONTINUED] doxycycline (VIBRAMYCIN) 100 MG capsule Take 1 capsule (100 mg total) by mouth 2 (two) times daily.  . [DISCONTINUED] losartan-hydrochlorothiazide (HYZAAR) 100-12.5 MG per tablet TAKE 1 TABLET BY MOUTH ONCE A DAY    Review of Systems  Constitutional: Negative.   HENT: Negative.   Eyes:  Negative.   Respiratory: Negative.   Cardiovascular: Negative.   Gastrointestinal: Negative.   Endocrine: Negative.   Genitourinary: Negative.   Musculoskeletal: Negative.   Skin: Negative.        Check left shoulder skin lesion  Allergic/Immunologic: Negative.   Neurological: Negative.   Hematological: Negative.   Psychiatric/Behavioral: Negative.        Objective:   Physical Exam  Nursing note and vitals reviewed. Constitutional: He is oriented to person, place, and time. He appears well-developed and well-nourished. No distress.  HENT:  Head: Normocephalic and atraumatic.  Right Ear: External ear normal.  Left Ear: External ear normal.  Nose: Nose normal.  Mouth/Throat: Oropharynx is clear and moist. No oropharyngeal exudate.  Eyes: Conjunctivae and EOM are normal. Pupils are equal, round, and reactive to light. Right eye exhibits no discharge. Left eye exhibits no discharge. No scleral icterus.  Neck: Normal range of motion. Neck supple. No tracheal deviation present. No thyromegaly present.  No thyromegaly or adenopathy  Cardiovascular: Normal rate, regular rhythm, normal heart sounds and intact distal pulses.  Exam reveals no gallop and no friction rub.   No murmur heard. Regular rate and rhythm at 72 per minute  Pulmonary/Chest: Effort normal and breath sounds normal. No respiratory distress. He has no wheezes. He has no rales. He exhibits no tenderness.  Abdominal: Soft. Bowel sounds are normal. He exhibits no mass. There is no tenderness. There is no rebound and  no guarding.  Musculoskeletal: Normal range of motion. He exhibits no edema and no tenderness.  Lymphadenopathy:    He has no cervical adenopathy.  Neurological: He is alert and oriented to person, place, and time. He has normal reflexes. No cranial nerve deficit.  Skin: Skin is warm and dry. No rash noted. No erythema. No pallor.  There was a raised skin lesion on the left superior shoulder. This had a  proximal lesion were treated with cryotherapy. Patient tolerated the procedure well.  Psychiatric: He has a normal mood and affect. His behavior is normal. Judgment and thought content normal.   BP 123/72  Pulse 69  Temp(Src) 97.2 F (36.2 C) (Oral)  Ht 5' 7.5" (1.715 m)  Wt 200 lb (90.719 kg)  BMI 30.84 kg/m2        Assessment & Plan:  1. BPH (benign prostatic hypertrophy) - POCT CBC - PSA, total and free  2. Elevated blood sugar - POCT CBC - POCT glycosylated hemoglobin (Hb A1C)  3. Metabolic syndrome - POCT CBC - POCT glycosylated hemoglobin (Hb A1C)  4. Essential hypertension - POCT CBC - BMP8+EGFR - Hepatic function panel  5. Hyperlipidemia - POCT CBC - NMR, lipoprofile  6. Vitamin D deficiency - POCT CBC - Vit D  25 hydroxy (rtn osteoporosis monitoring)  7. Elevated PSA - PSA, total and free  8. Skin lesion of left upper extremity -Cryotherapy performed and instructions given to patient about care of lesion   No orders of the defined types were placed in this encounter.   Patient Instructions                       Medicare Annual Wellness Visit  [place] and the medical providers at [facility] strive to bring you the best medical care.  In doing so we not only want to address your current medical conditions and concerns but also to detect new conditions early and prevent illness, disease and health-related problems.    Medicare offers a yearly Wellness Visit which allows our clinical staff to assess your need for preventative services including immunizations, lifestyle education, counseling to decrease risk of preventable diseases and screening for fall risk and other medical concerns.    This visit is provided free of charge (no copay) for all Medicare recipients. The clinical pharmacists at [facility] have begun to conduct these Wellness Visits which will also include a thorough review of all your  medications.    As you primary medical provider recommend that you make an appointment for your Annual Wellness Visit if you have not done so already this year.  You may set up this appointment before you leave today or you may call back [phone]) and schedule an appointment.  Please make sure when you call that you mention that you are scheduling your Annual Wellness Visit with the clinical pharmacist so that the appointment may be made for the proper length of time.     Continue current medications. Continue good therapeutic lifestyle changes which include good diet and exercise. Fall precautions discussed with patient. If an FOBT was given today- please return it to our front desk. If you are over 6 years old - you may need Prevnar 59 or the adult Pneumonia vaccine.  Flu Shots will be available at our office starting mid- September. Please call and schedule a FLU CLINIC APPOINTMENT.      Arrie Senate MD

## 2013-09-17 ENCOUNTER — Ambulatory Visit (AMBULATORY_SURGERY_CENTER): Payer: Self-pay

## 2013-09-17 ENCOUNTER — Other Ambulatory Visit: Payer: Self-pay | Admitting: *Deleted

## 2013-09-17 VITALS — Ht 68.0 in | Wt 202.0 lb

## 2013-09-17 DIAGNOSIS — Z860100 Personal history of colon polyps, unspecified: Secondary | ICD-10-CM

## 2013-09-17 DIAGNOSIS — R972 Elevated prostate specific antigen [PSA]: Secondary | ICD-10-CM

## 2013-09-17 LAB — HEPATIC FUNCTION PANEL
ALT: 16 IU/L (ref 0–44)
AST: 21 IU/L (ref 0–40)
Albumin: 4.1 g/dL (ref 3.5–4.8)
Alkaline Phosphatase: 80 IU/L (ref 39–117)
Bilirubin, Direct: 0.25 mg/dL (ref 0.00–0.40)
Total Bilirubin: 1 mg/dL (ref 0.0–1.2)
Total Protein: 6.8 g/dL (ref 6.0–8.5)

## 2013-09-17 LAB — NMR, LIPOPROFILE
Cholesterol: 131 mg/dL (ref 100–199)
HDL Cholesterol by NMR: 47 mg/dL (ref >39)
HDL Particle Number: 31.5 umol/L (ref >=30.5)
LDL PARTICLE NUMBER: 880 nmol/L (ref <1000)
LDL SIZE: 21.4 nm (ref >20.5)
LDLC SERPL CALC-MCNC: 66 mg/dL (ref 0–99)
LP-IR Score: 37 (ref <=45)
Small LDL Particle Number: 434 nmol/L (ref <=527)
Triglycerides by NMR: 91 mg/dL (ref 0–149)

## 2013-09-17 LAB — PSA, TOTAL AND FREE
PSA FREE PCT: 17 %
PSA, Free: 1.58 ng/mL
PSA: 9.3 ng/mL — AB (ref 0.0–4.0)

## 2013-09-17 LAB — BMP8+EGFR
BUN/Creatinine Ratio: 14 (ref 10–22)
BUN: 19 mg/dL (ref 8–27)
CHLORIDE: 99 mmol/L (ref 97–108)
CO2: 26 mmol/L (ref 18–29)
Calcium: 9.1 mg/dL (ref 8.6–10.2)
Creatinine, Ser: 1.32 mg/dL — ABNORMAL HIGH (ref 0.76–1.27)
GFR calc Af Amer: 62 mL/min/{1.73_m2} (ref >59)
GFR calc non Af Amer: 54 mL/min/{1.73_m2} — ABNORMAL LOW (ref >59)
Glucose: 103 mg/dL — ABNORMAL HIGH (ref 65–99)
Potassium: 4.9 mmol/L (ref 3.5–5.2)
SODIUM: 139 mmol/L (ref 134–144)

## 2013-09-17 LAB — VITAMIN D 25 HYDROXY (VIT D DEFICIENCY, FRACTURES): Vit D, 25-Hydroxy: 57.8 ng/mL (ref 30.0–100.0)

## 2013-09-17 MED ORDER — MOVIPREP 100 G PO SOLR: 1.0000 | Freq: Once | ORAL | Status: DC

## 2013-09-17 MED ORDER — SULFAMETHOXAZOLE-TMP DS 800-160 MG PO TABS: 1.0000 | ORAL_TABLET | Freq: Two times a day (BID) | ORAL | Status: DC

## 2013-09-17 NOTE — Progress Notes (Signed)
No allergies to eggs or soy No home oxygen No past problems with anesthesia No diet/weight loss meds  Has email.  Emmi instructions given for colonoscopy. 

## 2013-09-28 ENCOUNTER — Encounter: Payer: Self-pay | Admitting: Gastroenterology

## 2013-10-01 ENCOUNTER — Ambulatory Visit (AMBULATORY_SURGERY_CENTER): Payer: Medicare Other | Admitting: Gastroenterology

## 2013-10-01 ENCOUNTER — Encounter: Payer: Self-pay | Admitting: Gastroenterology

## 2013-10-01 VITALS — BP 131/70 | HR 71 | Temp 96.6°F | Resp 26 | Ht 68.0 in | Wt 202.0 lb

## 2013-10-01 DIAGNOSIS — D126 Benign neoplasm of colon, unspecified: Secondary | ICD-10-CM

## 2013-10-01 DIAGNOSIS — Z86010 Personal history of colonic polyps: Secondary | ICD-10-CM

## 2013-10-01 MED ORDER — DEXTROSE 5 % IV SOLN: INTRAVENOUS | Status: DC

## 2013-10-01 MED ORDER — SODIUM CHLORIDE 0.9 % IV SOLN: 500.0000 mL | INTRAVENOUS | Status: DC

## 2013-10-01 NOTE — Op Note (Signed)
[place]  [address] [place], [id number]   COLONOSCOPY PROCEDURE REPORT  PATIENT: Mario Powers, Mario Powers  MR#: [id number] BIRTHDATE: December 31, 1940 , 73  yrs. old GENDER: Male ENDOSCOPIST: Ladene Artist, MD, [facility] PROCEDURE DATE:  10/01/2013 PROCEDURE:   Colonoscopy with snare polypectomy First Screening Colonoscopy - Avg.  risk and is 50 yrs.  old or older - No.  Prior Negative Screening - Now for repeat screening. N/A  History of Adenoma - Now for follow-up colonoscopy & has been > or = to 3 yrs.  Yes hx of adenoma.  Has been 3 or more years since last colonoscopy.  Polyps Removed Today? Yes. ASA CLASS:   Class II INDICATIONS:Patient's personal history of adenomatous colon polyps.  MEDICATIONS: MAC sedation, administered by CRNA and propofol (Diprivan) 350mg  IV DESCRIPTION OF PROCEDURE:   After the risks benefits and alternatives of the procedure were thoroughly explained, informed consent was obtained.  A digital rectal exam revealed no abnormalities of the rectum.   The LB [id number]  endoscope was introduced through the anus and advanced to the cecum, which was identified by both the appendix and ileocecal valve. No adverse events experienced.   The quality of the prep was good, using MoviPrep  The instrument was then slowly withdrawn as the colon was fully examined.  COLON FINDINGS: A sessile polyp measuring 5 mm in size was found at the hepatic flexure.  A polypectomy was performed with a cold snare.  The resection was complete and the polyp tissue was completely retrieved.   A sessile polyp measuring 7 mm in size was found in the transverse colon.  A polypectomy was performed with a cold snare.  The resection was complete and the polyp tissue was completely retrieved.   The colon was otherwise normal.  There was no diverticulosis, inflammation, polyps or cancers unless previously stated.  Retroflexed views revealed no abnormalities. The time to  cecum=minutes 0 seconds.  Withdrawal time=11 minutes 46 seconds.  The scope was withdrawn and the procedure completed. COMPLICATIONS: There were no complications.  ENDOSCOPIC IMPRESSION: 1.   Sessile polyp measuring 5 mm at the hepatic flexure; polypectomy performed with a cold snare 2.   Sessile polyp measuring 7 mm in the transverse colon; polypectomy performed with a cold snare 3.   The colon was otherwise normal  RECOMMENDATIONS: 1.  Await pathology results 2.  Repeat Colonoscopy in 5 years.  eSigned:  Ladene Artist, MD, [facility] 10/01/2013 10:37 AM

## 2013-10-01 NOTE — Patient Instructions (Signed)
YOU HAD AN ENDOSCOPIC PROCEDURE TODAY AT THE [facility]: Refer to the procedure report that was given to you for any specific questions about what was found during the examination.  If the procedure report does not answer your questions, please call your gastroenterologist to clarify.  If you requested that your care partner not be given the details of your procedure findings, then the procedure report has been included in a sealed envelope for you to review at your convenience later.  YOU SHOULD EXPECT: Some feelings of bloating in the abdomen. Passage of more gas than usual.  Walking can help get rid of the air that was put into your GI tract during the procedure and reduce the bloating. If you had a lower endoscopy (such as a colonoscopy or flexible sigmoidoscopy) you may notice spotting of blood in your stool or on the toilet paper. If you underwent a bowel prep for your procedure, then you may not have a normal bowel movement for a few days.  DIET: Your first meal following the procedure should be a light meal and then it is ok to progress to your normal diet.  A half-sandwich or bowl of soup is an example of a good first meal.  Heavy or fried foods are harder to digest and may make you feel nauseous or bloated.  Likewise meals heavy in dairy and vegetables can cause extra gas to form and this can also increase the bloating.  Drink plenty of fluids but you should avoid alcoholic beverages for 24 hours.  ACTIVITY: Your care partner should take you home directly after the procedure.  You should plan to take it easy, moving slowly for the rest of the day.  You can resume normal activity the day after the procedure however you should NOT DRIVE or use heavy machinery for 24 hours (because of the sedation medicines used during the test).    SYMPTOMS TO REPORT IMMEDIATELY: A gastroenterologist can be reached at any hour.  During normal business hours, 8:30 AM to 5:00 PM Monday through Friday,  call [phone].  After hours and on weekends, please call the GI answering service at [phone] who will take a message and have the physician on call contact you.   Following lower endoscopy (colonoscopy or flexible sigmoidoscopy):  Excessive amounts of blood in the stool  Significant tenderness or worsening of abdominal pains  Swelling of the abdomen that is new, acute  Fever of 100F or higher  FOLLOW UP: If any biopsies were taken you will be contacted by phone or by letter within the next 1-3 weeks.  Call your gastroenterologist if you have not heard about the biopsies in 3 weeks.  Our staff will call the home number listed on your records the next business day following your procedure to check on you and address any questions or concerns that you may have at that time regarding the information given to you following your procedure. This is a courtesy call and so if there is no answer at the home number and we have not heard from you through the emergency physician on call, we will assume that you have returned to your regular daily activities without incident.  SIGNATURES/CONFIDENTIALITY: You and/or your care partner have signed paperwork which will be entered into your electronic medical record.  These signatures attest to the fact that that the information above on your After Visit Summary has been reviewed and is understood.  Full responsibility of the confidentiality of this   discharge information lies with you and/or your care-partner.   Please, read the handouts given to you by your recovery room nurse. 

## 2013-10-01 NOTE — Progress Notes (Signed)
Procedure ends, to recovery, report given and VSS. 

## 2013-10-01 NOTE — Progress Notes (Signed)
Called to room to assist during endoscopic procedure.  Patient ID and intended procedure confirmed with present staff. Received instructions for my participation in the procedure from the performing physician.  

## 2013-10-05 ENCOUNTER — Telehealth: Payer: Self-pay | Admitting: *Deleted

## 2013-10-05 NOTE — Telephone Encounter (Signed)
  Follow up Call-  Call back number 10/01/2013  Post procedure Call Back phone  # [phone]  Permission to leave phone message Yes     Patient questions:  Do you have a fever, pain , or abdominal swelling? No. Pain Score  0 *  Have you tolerated food without any problems? Yes.    Have you been able to return to your normal activities? Yes.    Do you have any questions about your discharge instructions: Diet   No. Medications  No. Follow up visit  No.  Do you have questions or concerns about your Care? No.  Actions: * If pain score is 4 or above: No action needed, pain <4.

## 2013-10-06 ENCOUNTER — Other Ambulatory Visit: Payer: Self-pay | Admitting: Family Medicine

## 2013-10-10 ENCOUNTER — Encounter: Payer: Self-pay | Admitting: Gastroenterology

## 2013-10-21 ENCOUNTER — Other Ambulatory Visit (INDEPENDENT_AMBULATORY_CARE_PROVIDER_SITE_OTHER): Payer: Medicare Other

## 2013-10-21 DIAGNOSIS — R972 Elevated prostate specific antigen [PSA]: Secondary | ICD-10-CM

## 2013-10-22 ENCOUNTER — Other Ambulatory Visit: Payer: Self-pay | Admitting: Pharmacist

## 2013-10-22 ENCOUNTER — Telehealth: Payer: Self-pay | Admitting: *Deleted

## 2013-10-22 LAB — PSA, TOTAL AND FREE
PSA FREE: 0.56 ng/mL
PSA, Free Pct: 17 %
PSA: 3.3 ng/mL (ref 0.0–4.0)

## 2013-10-22 NOTE — Progress Notes (Signed)
Patient was contact by Leim Fabry, Pharm D with [facility].  Per her report he is only taking atorvastatin 80mg  1/2 tablet daily for several months.  Last lipid panel shows at goals with current dose.  He is also taking losartan HCT 100/12.5mg  1/2 tablet daily.  BP has been well controlled on this dose. Updated med list to reflect how he is currently taking medication.

## 2013-10-22 NOTE — Telephone Encounter (Signed)
Pt notified of lab results Verbalizes understanding 

## 2013-12-09 ENCOUNTER — Encounter: Payer: Self-pay | Admitting: Family Medicine

## 2013-12-19 ENCOUNTER — Other Ambulatory Visit: Payer: Self-pay | Admitting: Family Medicine

## 2013-12-20 NOTE — Telephone Encounter (Signed)
Refill per protocol, next appt is in December.

## 2014-01-06 ENCOUNTER — Other Ambulatory Visit: Payer: Self-pay | Admitting: Family Medicine

## 2014-01-19 ENCOUNTER — Ambulatory Visit (INDEPENDENT_AMBULATORY_CARE_PROVIDER_SITE_OTHER): Payer: Medicare Other | Admitting: Family Medicine

## 2014-01-19 ENCOUNTER — Encounter: Payer: Self-pay | Admitting: Family Medicine

## 2014-01-19 VITALS — BP 130/75 | HR 82 | Temp 96.5°F | Ht 68.0 in | Wt 203.0 lb

## 2014-01-19 DIAGNOSIS — R972 Elevated prostate specific antigen [PSA]: Secondary | ICD-10-CM

## 2014-01-19 DIAGNOSIS — E559 Vitamin D deficiency, unspecified: Secondary | ICD-10-CM

## 2014-01-19 DIAGNOSIS — E785 Hyperlipidemia, unspecified: Secondary | ICD-10-CM

## 2014-01-19 DIAGNOSIS — N400 Enlarged prostate without lower urinary tract symptoms: Secondary | ICD-10-CM

## 2014-01-19 DIAGNOSIS — I10 Essential (primary) hypertension: Secondary | ICD-10-CM

## 2014-01-19 LAB — POCT CBC
Granulocyte percent: 52 %G (ref 37–80)
HCT, POC: 45 % (ref 43.5–53.7)
Hemoglobin: 14.4 g/dL (ref 14.1–18.1)
Lymph, poc: 2.8 (ref 0.6–3.4)
MCH, POC: 32.5 pg — AB (ref 27–31.2)
MCHC: 32 g/dL (ref 31.8–35.4)
MCV: 101.6 fL — AB (ref 80–97)
MPV: 7.3 fL (ref 0–99.8)
PLATELET COUNT, POC: 275 10*3/uL (ref 142–424)
POC Granulocyte: 3.5 (ref 2–6.9)
POC LYMPH %: 41 % (ref 10–50)
RBC: 4.4 M/uL — AB (ref 4.69–6.13)
RDW, POC: 13 %
WBC: 6.8 10*3/uL (ref 4.6–10.2)

## 2014-01-19 NOTE — Progress Notes (Signed)
Subjective:    Patient ID: Mario Powers, male    DOB: [birth year], 73 y.o.   MRN: [id number]  HPI Pt here for follow up and management of chronic medical problems. The patient's significant history is that he has had periodic elevations of his PSA. He is due to get a prostate exam today and we'll get his routine lab work today.         Patient Active Problem List   Diagnosis Date Noted  . Metabolic syndrome 35/00/9381  . Elevated blood sugar 01/13/2013  . Personal history of colonic polyps 01/13/2013  . Hyperlipidemia 07/22/2012  . Hypertension 07/22/2012  . BPH (benign prostatic hypertrophy) 07/22/2012  . Vitamin D deficiency 07/22/2012   Outpatient Encounter Prescriptions as of 01/19/2014  Medication Sig  . aspirin 81 MG tablet Take 81 mg by mouth daily.  Marland Kitchen atorvastatin (LIPITOR) 80 MG tablet Take 0.5 tablets (40 mg total) by mouth daily. As directed  . Cholecalciferol (VITAMIN D) 2000 UNITS CAPS Take 1 capsule by mouth daily.  . fish oil-omega-3 fatty acids 1000 MG capsule Take 2 g by mouth daily.  Marland Kitchen losartan-hydrochlorothiazide (HYZAAR) 100-12.5 MG per tablet TAKE 1 TABLET BY MOUTH ONCE A DAY  . Multiple Vitamin (MULTIVITAMIN) tablet Take 1 tablet by mouth daily.  . niacin (NIASPAN) 1000 MG CR tablet TAKE 3 TABLETS BY MOUTH AT BEDTIME  . pioglitazone (ACTOS) 30 MG tablet TAKE 1 TABLET BY MOUTH EVERY DAY  . [DISCONTINUED] losartan-hydrochlorothiazide (HYZAAR) 100-12.5 MG per tablet Take 0.5 tablets by mouth daily.  . [DISCONTINUED] sulfamethoxazole-trimethoprim (BACTRIM DS) 800-160 MG per tablet Take 1 tablet by mouth 2 (two) times daily.    Review of Systems  Constitutional: Negative.   HENT: Negative.   Eyes: Negative.   Respiratory: Negative.   Cardiovascular: Negative.   Gastrointestinal: Negative.   Endocrine: Negative.   Genitourinary: Negative.   Musculoskeletal: Negative.   Skin: Negative.   Allergic/Immunologic: Negative.   Neurological: Negative.     Hematological: Negative.   Psychiatric/Behavioral: Negative.        Objective:   Physical Exam  Constitutional: He is oriented to person, place, and time. He appears well-developed and well-nourished. No distress.  The patient is pleasant and cooperative and alert.  HENT:  Head: Normocephalic and atraumatic.  Right Ear: External ear normal.  Left Ear: External ear normal.  Nose: Nose normal.  Mouth/Throat: Oropharynx is clear and moist. No oropharyngeal exudate.  Eyes: Conjunctivae and EOM are normal. Pupils are equal, round, and reactive to light. Right eye exhibits no discharge. Left eye exhibits no discharge. No scleral icterus.  Neck: Normal range of motion. Neck supple. No thyromegaly present.  There is no thyromegaly or anterior cervical nodes. There are no carotid bruits.  Cardiovascular: Normal rate, regular rhythm, normal heart sounds and intact distal pulses.  Exam reveals no gallop and no friction rub.   No murmur heard. The heart has a regular rate and rhythm at 72/m.  Pulmonary/Chest: Effort normal and breath sounds normal. No respiratory distress. He has no wheezes. He has no rales.  There is no axillary adenopathy  Abdominal: Soft. Bowel sounds are normal. He exhibits no mass. There is no tenderness. There is no rebound and no guarding.  There is no inguinal adenopathy. There is no organomegaly or abdominal tenderness or masses. The patient recently had a colonoscopy and polyps were found and a repeat colonoscopy is planned in 5 years.  Genitourinary: Rectum normal and penis normal.  The prostate is  enlarged, but smooth. There are no rectal masses. The external genitalia were within normal limits without hernias or masses.  Musculoskeletal: Normal range of motion. He exhibits no edema or tenderness.  Lymphadenopathy:    He has no cervical adenopathy.  Neurological: He is alert and oriented to person, place, and time. He has normal reflexes. No cranial nerve deficit.   Skin: Skin is warm and dry. No rash noted.  Psychiatric: He has a normal mood and affect. His behavior is normal. Judgment and thought content normal.  Nursing note and vitals reviewed.  BP 130/75 mmHg  Pulse 82  Temp(Src) 96.5 F (35.8 C) (Oral)  Ht $R'5\' 8"'rd$  (1.727 m)  Wt 203 lb (92.08 kg)  BMI 30.87 kg/m2        Assessment & Plan:  1. BPH (benign prostatic hypertrophy) - POCT CBC  2. Essential hypertension - POCT CBC - BMP8+EGFR - Hepatic function panel  3. Hyperlipidemia - POCT CBC - NMR, lipoprofile  4. Vitamin D deficiency - POCT CBC - Vit D  25 hydroxy (rtn osteoporosis monitoring)  5. Elevated PSA - POCT CBC - PSA, total and free  Patient Instructions                       Medicare Annual Wellness Visit  [facility] and the medical providers at [facility] strive to bring you the best medical care.  In doing so we not only want to address your current medical conditions and concerns but also to detect new conditions early and prevent illness, disease and health-related problems.    Medicare offers a yearly Wellness Visit which allows our clinical staff to assess your need for preventative services including immunizations, lifestyle education, counseling to decrease risk of preventable diseases and screening for fall risk and other medical concerns.    This visit is provided free of charge (no copay) for all Medicare recipients. The clinical pharmacists at [facility] have begun to conduct these Wellness Visits which will also include a thorough review of all your medications.    As you primary medical provider recommend that you make an appointment for your Annual Wellness Visit if you have not done so already this year.  You may set up this appointment before you leave today or you may call back ([phone]) and schedule an appointment.  Please make sure when you call that you mention that you are scheduling your Annual  Wellness Visit with the clinical pharmacist so that the appointment may be made for the proper length of time.     Continue current medications. Continue good therapeutic lifestyle changes which include good diet and exercise. Fall precautions discussed with patient. If an FOBT was given today- please return it to our front desk. If you are over 25 years old - you may need Prevnar 39 or the adult Pneumonia vaccine.  Flu Shots will be available at our office starting mid- September. Please call and schedule a FLU CLINIC APPOINTMENT.   Continue to exercise regularly and always drink plenty of fluids. We will call you with the results of your lab work is sent as those results become available Monitor blood pressures outside of the office when possible.   Arrie Senate MD

## 2014-01-19 NOTE — Patient Instructions (Addendum)
Medicare Annual Wellness Visit  [facility] and the medical providers at [facility] strive to bring you the best medical care.  In doing so we not only want to address your current medical conditions and concerns but also to detect new conditions early and prevent illness, disease and health-related problems.    Medicare offers a yearly Wellness Visit which allows our clinical staff to assess your need for preventative services including immunizations, lifestyle education, counseling to decrease risk of preventable diseases and screening for fall risk and other medical concerns.    This visit is provided free of charge (no copay) for all Medicare recipients. The clinical pharmacists at [facility] have begun to conduct these Wellness Visits which will also include a thorough review of all your medications.    As you primary medical provider recommend that you make an appointment for your Annual Wellness Visit if you have not done so already this year.  You may set up this appointment before you leave today or you may call back [phone]) and schedule an appointment.  Please make sure when you call that you mention that you are scheduling your Annual Wellness Visit with the clinical pharmacist so that the appointment may be made for the proper length of time.     Continue current medications. Continue good therapeutic lifestyle changes which include good diet and exercise. Fall precautions discussed with patient. If an FOBT was given today- please return it to our front desk. If you are over 38 years old - you may need Prevnar 22 or the adult Pneumonia vaccine.  Flu Shots will be available at our office starting mid- September. Please call and schedule a FLU CLINIC APPOINTMENT.   Continue to exercise regularly and always drink plenty of fluids. We will call you with the results of your lab work is sent as those results become  available Monitor blood pressures outside of the office when possible.

## 2014-01-20 LAB — HEPATIC FUNCTION PANEL
ALT: 24 IU/L (ref 0–44)
AST: 30 IU/L (ref 0–40)
Albumin: 4 g/dL (ref 3.5–4.8)
Alkaline Phosphatase: 121 IU/L — ABNORMAL HIGH (ref 39–117)
Bilirubin, Direct: 0.27 mg/dL (ref 0.00–0.40)
Total Bilirubin: 1 mg/dL (ref 0.0–1.2)
Total Protein: 6.6 g/dL (ref 6.0–8.5)

## 2014-01-20 LAB — BMP8+EGFR
BUN / CREAT RATIO: 19 (ref 10–22)
BUN: 22 mg/dL (ref 8–27)
CHLORIDE: 102 mmol/L (ref 97–108)
CO2: 27 mmol/L (ref 18–29)
Calcium: 9.6 mg/dL (ref 8.6–10.2)
Creatinine, Ser: 1.18 mg/dL (ref 0.76–1.27)
GFR calc Af Amer: 71 mL/min/{1.73_m2} (ref >59)
GFR calc non Af Amer: 61 mL/min/{1.73_m2} (ref >59)
Glucose: 101 mg/dL — ABNORMAL HIGH (ref 65–99)
POTASSIUM: 4.8 mmol/L (ref 3.5–5.2)
Sodium: 141 mmol/L (ref 134–144)

## 2014-01-20 LAB — NMR, LIPOPROFILE
Cholesterol: 127 mg/dL (ref 100–199)
HDL CHOLESTEROL BY NMR: 49 mg/dL (ref >39)
HDL Particle Number: 31.6 umol/L (ref >=30.5)
LDL Particle Number: 860 nmol/L (ref <1000)
LDL Size: 21.4 nm (ref >20.5)
LDL-C: 65 mg/dL (ref 0–99)
LP-IR Score: 57 — ABNORMAL HIGH (ref <=45)
SMALL LDL PARTICLE NUMBER: 378 nmol/L (ref <=527)
Triglycerides by NMR: 65 mg/dL (ref 0–149)

## 2014-01-20 LAB — PSA, TOTAL AND FREE
PSA FREE PCT: 14.7 %
PSA, Free: 0.63 ng/mL
PSA: 4.3 ng/mL — AB (ref 0.0–4.0)

## 2014-01-20 LAB — VITAMIN D 25 HYDROXY (VIT D DEFICIENCY, FRACTURES): Vit D, 25-Hydroxy: 49.6 ng/mL (ref 30.0–100.0)

## 2014-01-31 ENCOUNTER — Other Ambulatory Visit: Payer: Self-pay | Admitting: Family Medicine

## 2014-03-01 ENCOUNTER — Other Ambulatory Visit (INDEPENDENT_AMBULATORY_CARE_PROVIDER_SITE_OTHER): Payer: Medicare Other

## 2014-03-01 DIAGNOSIS — R972 Elevated prostate specific antigen [PSA]: Secondary | ICD-10-CM

## 2014-03-01 NOTE — Progress Notes (Signed)
Lab only for PSA

## 2014-03-02 LAB — PSA, TOTAL AND FREE
PSA, Free Pct: 17.6 %
PSA, Free: 0.58 ng/mL
PSA: 3.3 ng/mL (ref 0.0–4.0)

## 2014-04-02 ENCOUNTER — Other Ambulatory Visit: Payer: Self-pay | Admitting: Family Medicine

## 2014-04-12 ENCOUNTER — Other Ambulatory Visit: Payer: Self-pay | Admitting: Dermatology

## 2014-04-12 DIAGNOSIS — D0461 Carcinoma in situ of skin of right upper limb, including shoulder: Secondary | ICD-10-CM | POA: Diagnosis not present

## 2014-04-12 DIAGNOSIS — D0460 Carcinoma in situ of skin of unspecified upper limb, including shoulder: Secondary | ICD-10-CM | POA: Diagnosis not present

## 2014-04-12 DIAGNOSIS — L570 Actinic keratosis: Secondary | ICD-10-CM | POA: Diagnosis not present

## 2014-04-13 ENCOUNTER — Other Ambulatory Visit: Payer: Self-pay | Admitting: Family Medicine

## 2014-04-29 HISTORY — PX: SKIN CANCER EXCISION: SHX779

## 2014-05-19 DIAGNOSIS — D0460 Carcinoma in situ of skin of unspecified upper limb, including shoulder: Secondary | ICD-10-CM | POA: Diagnosis not present

## 2014-05-19 DIAGNOSIS — L820 Inflamed seborrheic keratosis: Secondary | ICD-10-CM | POA: Diagnosis not present

## 2014-05-19 DIAGNOSIS — D485 Neoplasm of uncertain behavior of skin: Secondary | ICD-10-CM | POA: Diagnosis not present

## 2014-05-31 ENCOUNTER — Encounter: Payer: Self-pay | Admitting: *Deleted

## 2014-05-31 ENCOUNTER — Ambulatory Visit (INDEPENDENT_AMBULATORY_CARE_PROVIDER_SITE_OTHER): Payer: Medicare Other | Admitting: *Deleted

## 2014-05-31 VITALS — BP 126/77 | HR 70 | Resp 20 | Ht 68.0 in | Wt 208.0 lb

## 2014-05-31 DIAGNOSIS — Z0000 Encounter for general adult medical examination without abnormal findings: Secondary | ICD-10-CM

## 2014-05-31 DIAGNOSIS — Z23 Encounter for immunization: Secondary | ICD-10-CM

## 2014-05-31 NOTE — Progress Notes (Signed)
Subjective:   Mario Powers is a 73 y.o. male who presents for an Initial Medicare Annual Wellness Visit. Mario Powers is retired from [employer]. He is married and has 2 children. He reports that he had 2 skin cancer excisions a couple of weeks ago by Dr. Marge Duncans on is right and left forearms. They appear to be healing well. [place]  Review of Systems   Cardiac Risk Factors include: advanced age (>22men, >25 women);dyslipidemia;hypertension;male gender;smoking/ tobacco exposure    Objective:    Today's Vitals   05/31/14 1110  BP: 126/77  Pulse: 70  Resp: 20  Height: 5\' 8"  (1.727 m)  Weight: 208 lb (94.348 kg)    Current Medications (verified) Outpatient Encounter Prescriptions as of 05/31/2014  Medication Sig  . aspirin 81 MG tablet Take 81 mg by mouth daily.  Marland Kitchen atorvastatin (LIPITOR) 80 MG tablet Take 0.5 tablets (40 mg total) by mouth daily. As directed  . Cholecalciferol (VITAMIN D) 2000 UNITS CAPS Take 1 capsule by mouth daily.  . fish oil-omega-3 fatty acids 1000 MG capsule Take 2 g by mouth daily.  Marland Kitchen losartan-hydrochlorothiazide (HYZAAR) 100-12.5 MG per tablet TAKE 1 TABLET BY MOUTH ONCE A DAY  . Multiple Vitamin (MULTIVITAMIN) tablet Take 1 tablet by mouth daily.  . niacin (NIASPAN) 1000 MG CR tablet TAKE 3 TABLETS BY MOUTH AT BEDTIME  . pioglitazone (ACTOS) 30 MG tablet TAKE 1 TABLET BY MOUTH EVERY DAY  . atorvastatin (LIPITOR) 80 MG tablet TAKE 1 TABLET BY MOUTH DAILY.   No facility-administered encounter medications on file as of 05/31/2014.    Allergies (verified) Review of patient's allergies indicates no known allergies.   History: Past Medical History  Diagnosis Date  . Hyperlipidemia   . Hypertension   . Vitamin D deficiency   . BPH (benign prostatic hyperplasia)    Past Surgical History  Procedure Laterality Date  . Anal fissure repair N/A 1980  . Turp  2012  . Prostate  surgery  2012    He has had a TURP and multiple prostate biopsies   Family History  Problem Relation Age of Onset  . Colon cancer Neg Hx   . Pancreatic cancer Neg Hx   . Rectal cancer Neg Hx   . Stomach cancer Neg Hx   . Dementia Mother   . Irregular heart beat Mother   . Aneurysm Mother   . Hypertension Father   . Transient ischemic attack Father    Social History   Occupational History  . Retired     Programme researcher, broadcasting/film/video   Social History Main Topics  . Smoking status: Former Smoker -- 3.00 packs/day for 25 years    Types: Cigarettes    Quit date: 01/28/1986  . Smokeless tobacco: Never Used  . Alcohol Use: 1.2 oz/week    1 Glasses of wine, 1 Shots of liquor per week     Comment: a few beers a week  . Drug Use: No  . Sexual Activity:    Partners: Female   Tobacco Counseling NA   Activities of Daily Living In your present state of health, do you have any difficulty performing the following activities: 05/31/2014  Hearing? N  Vision? N  Difficulty concentrating or making decisions? N  Walking or climbing stairs? N  Dressing or bathing? N  Doing errands, shopping? N  Preparing Food and eating ? N  Using the  Toilet? N  In the past six months, have you accidently leaked urine? N  Do you have problems with loss of bowel control? N  Managing your Medications? N  Managing your Finances? N  Housekeeping or managing your Housekeeping? N    Immunizations and Health Maintenance Immunization History  Administered Date(s) Administered  . Influenza-Unspecified 10/28/2012, 11/16/2013  . Pneumococcal Conjugate-13 01/13/2013  . Pneumococcal Polysaccharide-23 08/29/2007  . Tdap 05/31/2014  . Zoster 11/28/2005   There are no preventive care reminders to display for this patient.  Patient Care Team: Chipper Herb, MD as PCP - General (Family Medicine)     Assessment:   This is a routine wellness examination for Mario Powers.   Hearing/Vision screen No hearing or vision  deficits noted. He does wear glasses and is due for his routine eye exam later this fall with [facility] in [place] and will call for an appointment when time.  Dietary issues and exercise activities discussed: Current Exercise Habits:: Home exercise routine, Type of exercise: walking, Time (Minutes): 60, Frequency (Times/Week): 6, Weekly Exercise (Minutes/Week): 360, Intensity: Moderate  Goals    None     Depression Screen PHQ 2/9 Scores 05/31/2014 01/19/2014 05/11/2013  PHQ - 2 Score 0 0 0    Fall Risk Fall Risk  05/31/2014 01/19/2014 05/11/2013 07/22/2012  Falls in the past year? No No No No  Risk for fall due to : - - Other (Comment) -  Risk for fall due to (comments): - - age related -    Cognitive Function: MMSE - Mini Mental State Exam 05/31/2014  Orientation to time 5  Orientation to Place 5  Registration 3  Attention/ Calculation 5  Recall 3  Language- name 2 objects 2  Language- repeat 1  Language- follow 3 step command 3  Language- read & follow direction 1  Write a sentence 1  Copy design 1  Total score 30    Screening Tests Health Maintenance  Topic Date Due  . INFLUENZA VACCINE  08/29/2014  . COLONOSCOPY  10/02/2018  . TETANUS/TDAP  05/30/2024  . ZOSTAVAX  Completed  . PNA vac Low Risk Adult  Completed        Plan:  Continue therapeutic lifestyle changes which include good diet and exercise  Continue current medication as ordered Continue to monitor BP at home and record and bring readings to next office visit Continue to walk 2 miles per day 6 days per week. Keep follow up appointment with Dr. Laurance Flatten on 06/01/14   During the course of the visit Mario Powers was educated and counseled about the following appropriate screening and preventive services:   Vaccines to include Pneumoccal, Influenza,Tdap, Zostavax : all up to date except Tdap and it was given today in the office  Electrocardiogram last performed 07/30/2010  Colorectal cancer screening : Colonoscopy  performed 10/01/13 per Dr. Fuller Plan hx of polyps and due every 5 years. FOBT given to patient and he will return to the office   Cardiovascular disease screening last lipids at goal on 01/19/14   Diabetes screening last HbgA1C on 09/16/13 was 5.5%   Glaucoma screening eye exam due later this fall and patient will call for an appointment   Nutrition counseling DASH diet reccommended   Prostate cancer screening PSA's are followed by Dr. Laurance Flatten BPH and  history of TURP in 2012 last PSA 03/01/14 was 3.3  Smoking cessation counseling NA  Patient Instructions (the written plan) were given to the patient.   Mario Powers, Mario Freestone, RN  05/31/2014       I have reviewed and agree with the above AWV documentation.  Claretta Fraise, M.D.

## 2014-05-31 NOTE — Patient Instructions (Addendum)
Preventive Care for Adults A healthy lifestyle and preventive care can promote health and wellness. Preventive health guidelines for men include the following key practices:  A routine yearly physical is a good way to check with your health care provider about your health and preventative screening. It is a chance to share any concerns and updates on your health and to receive a thorough exam.  Visit your dentist for a routine exam and preventative care every 6 months. Brush your teeth twice a day and floss once a day. Good oral hygiene prevents tooth decay and gum disease.  The frequency of eye exams is based on your age, health, family medical history, use of contact lenses, and other factors. Follow your health care provider's recommendations for frequency of eye exams.  Eat a healthy diet. Foods such as vegetables, fruits, whole grains, low-fat dairy products, and lean protein foods contain the nutrients you need without too many calories. Decrease your intake of foods high in solid fats, added sugars, and salt. Eat the right amount of calories for you.Get information about a proper diet from your health care provider, if necessary.  Regular physical exercise is one of the most important things you can do for your health. Most adults should get at least 150 minutes of moderate-intensity exercise (any activity that increases your heart rate and causes you to sweat) each week. In addition, most adults need muscle-strengthening exercises on 2 or more days a week.  Maintain a healthy weight. The body mass index (BMI) is a screening tool to identify possible weight problems. It provides an estimate of body fat based on height and weight. Your health care provider can find your BMI and can help you achieve or maintain a healthy weight.For adults 20 years and older:  A BMI below 18.5 is considered underweight.  A BMI of 18.5 to 24.9 is normal.  A BMI of 25 to 29.9 is considered overweight.  A BMI  of 30 and above is considered obese.  Maintain normal blood lipids and cholesterol levels by exercising and minimizing your intake of saturated fat. Eat a balanced diet with plenty of fruit and vegetables. Blood tests for lipids and cholesterol should begin at age 50 and be repeated every 5 years. If your lipid or cholesterol levels are high, you are over 50, or you are at high risk for heart disease, you may need your cholesterol levels checked more frequently.Ongoing high lipid and cholesterol levels should be treated with medicines if diet and exercise are not working.  If you smoke, find out from your health care provider how to quit. If you do not use tobacco, do not start.  Lung cancer screening is recommended for adults aged 73-80 years who are at high risk for developing lung cancer because of a history of smoking. A yearly low-dose CT scan of the lungs is recommended for people who have at least a 30-pack-year history of smoking and are a current smoker or have quit within the past 15 years. A pack year of smoking is smoking an average of 1 pack of cigarettes a day for 1 year (for example: 1 pack a day for 30 years or 2 packs a day for 15 years). Yearly screening should continue until the smoker has stopped smoking for at least 15 years. Yearly screening should be stopped for people who develop a health problem that would prevent them from having lung cancer treatment.  If you choose to drink alcohol, do not have more than  2 drinks per day. One drink is considered to be 12 ounces (355 mL) of beer, 5 ounces (148 mL) of wine, or 1.5 ounces (44 mL) of liquor.  Avoid use of street drugs. Do not share needles with anyone. Ask for help if you need support or instructions about stopping the use of drugs.  High blood pressure causes heart disease and increases the risk of stroke. Your blood pressure should be checked at least every 1-2 years. Ongoing high blood pressure should be treated with  medicines, if weight loss and exercise are not effective.  If you are 45-79 years old, ask your health care provider if you should take aspirin to prevent heart disease.  Diabetes screening involves taking a blood sample to check your fasting blood sugar level. This should be done once every 3 years, after age 45, if you are within normal weight and without risk factors for diabetes. Testing should be considered at a younger age or be carried out more frequently if you are overweight and have at least 1 risk factor for diabetes.  Colorectal cancer can be detected and often prevented. Most routine colorectal cancer screening begins at the age of 50 and continues through age 75. However, your health care provider may recommend screening at an earlier age if you have risk factors for colon cancer. On a yearly basis, your health care provider may provide home test kits to check for hidden blood in the stool. Use of a small camera at the end of a tube to directly examine the colon (sigmoidoscopy or colonoscopy) can detect the earliest forms of colorectal cancer. Talk to your health care provider about this at age 50, when routine screening begins. Direct exam of the colon should be repeated every 5-10 years through age 75, unless early forms of precancerous polyps or small growths are found.  People who are at an increased risk for hepatitis B should be screened for this virus. You are considered at high risk for hepatitis B if:  You were born in a country where hepatitis B occurs often. Talk with your health care provider about which countries are considered high risk.  Your parents were born in a high-risk country and you have not received a shot to protect against hepatitis B (hepatitis B vaccine).  You have HIV or AIDS.  You use needles to inject street drugs.  You live with, or have sex with, someone who has hepatitis B.  You are a man who has sex with other men (MSM).  You get hemodialysis  treatment.  You take certain medicines for conditions such as cancer, organ transplantation, and autoimmune conditions.  Hepatitis C blood testing is recommended for all people born from 1945 through 1965 and any individual with known risks for hepatitis C.  Practice safe sex. Use condoms and avoid high-risk sexual practices to reduce the spread of sexually transmitted infections (STIs). STIs include gonorrhea, chlamydia, syphilis, trichomonas, herpes, HPV, and human immunodeficiency virus (HIV). Herpes, HIV, and HPV are viral illnesses that have no cure. They can result in disability, cancer, and death.  If you are at risk of being infected with HIV, it is recommended that you take a prescription medicine daily to prevent HIV infection. This is called preexposure prophylaxis (PrEP). You are considered at risk if:  You are a man who has sex with other men (MSM) and have other risk factors.  You are a heterosexual man, are sexually active, and are at increased risk for HIV infection.    You take drugs by injection.  You are sexually active with a partner who has HIV.  Talk with your health care provider about whether you are at high risk of being infected with HIV. If you choose to begin PrEP, you should first be tested for HIV. You should then be tested every 3 months for as long as you are taking PrEP.  A one-time screening for abdominal aortic aneurysm (AAA) and surgical repair of large AAAs by ultrasound are recommended for men ages 32 to 67 years who are current or former smokers.  Healthy men should no longer receive prostate-specific antigen (PSA) blood tests as part of routine cancer screening. Talk with your health care provider about prostate cancer screening.  Testicular cancer screening is not recommended for adult males who have no symptoms. Screening includes self-exam, a health care provider exam, and other screening tests. Consult with your health care provider about any symptoms  you have or any concerns you have about testicular cancer.  Use sunscreen. Apply sunscreen liberally and repeatedly throughout the day. You should seek shade when your shadow is shorter than you. Protect yourself by wearing long sleeves, pants, a wide-brimmed hat, and sunglasses year round, whenever you are outdoors.  Once a month, do a whole-body skin exam, using a mirror to look at the skin on your back. Tell your health care provider about new moles, moles that have irregular borders, moles that are larger than a pencil eraser, or moles that have changed in shape or color.  Stay current with required vaccines (immunizations).  Influenza vaccine. All adults should be immunized every year.  Tetanus, diphtheria, and acellular pertussis (Td, Tdap) vaccine. An adult who has not previously received Tdap or who does not know his vaccine status should receive 1 dose of Tdap. This initial dose should be followed by tetanus and diphtheria toxoids (Td) booster doses every 10 years. Adults with an unknown or incomplete history of completing a 3-dose immunization series with Td-containing vaccines should begin or complete a primary immunization series including a Tdap dose. Adults should receive a Td booster every 10 years.  Varicella vaccine. An adult without evidence of immunity to varicella should receive 2 doses or a second dose if he has previously received 1 dose.  Human papillomavirus (HPV) vaccine. Males aged 68-21 years who have not received the vaccine previously should receive the 3-dose series. Males aged 22-26 years may be immunized. Immunization is recommended through the age of 6 years for any male who has sex with males and did not get any or all doses earlier. Immunization is recommended for any person with an immunocompromised condition through the age of 49 years if he did not get any or all doses earlier. During the 3-dose series, the second dose should be obtained 4-8 weeks after the first  dose. The third dose should be obtained 24 weeks after the first dose and 16 weeks after the second dose.  Zoster vaccine. One dose is recommended for adults aged 50 years or older unless certain conditions are present.  Measles, mumps, and rubella (MMR) vaccine. Adults born before [birth year] generally are considered immune to measles and mumps. Adults born in [birth year] or later should have 1 or more doses of MMR vaccine unless there is a contraindication to the vaccine or there is laboratory evidence of immunity to each of the three diseases. A routine second dose of MMR vaccine should be obtained at least 28 days after the first dose for students attending postsecondary  schools, health care workers, or international travelers. People who received inactivated measles vaccine or an unknown type of measles vaccine during 1963-1967 should receive 2 doses of MMR vaccine. People who received inactivated mumps vaccine or an unknown type of mumps vaccine before 1979 and are at high risk for mumps infection should consider immunization with 2 doses of MMR vaccine. Unvaccinated health care workers born before 1957 who lack laboratory evidence of measles, mumps, or rubella immunity or laboratory confirmation of disease should consider measles and mumps immunization with 2 doses of MMR vaccine or rubella immunization with 1 dose of MMR vaccine.  Pneumococcal 13-valent conjugate (PCV13) vaccine. When indicated, a person who is uncertain of his immunization history and has no record of immunization should receive the PCV13 vaccine. An adult aged 19 years or older who has certain medical conditions and has not been previously immunized should receive 1 dose of PCV13 vaccine. This PCV13 should be followed with a dose of pneumococcal polysaccharide (PPSV23) vaccine. The PPSV23 vaccine dose should be obtained at least 8 weeks after the dose of PCV13 vaccine. An adult aged 19 years or older who has certain medical conditions and  previously received 1 or more doses of PPSV23 vaccine should receive 1 dose of PCV13. The PCV13 vaccine dose should be obtained 1 or more years after the last PPSV23 vaccine dose.  Pneumococcal polysaccharide (PPSV23) vaccine. When PCV13 is also indicated, PCV13 should be obtained first. All adults aged 65 years and older should be immunized. An adult younger than age 65 years who has certain medical conditions should be immunized. Any person who resides in a nursing home or long-term care facility should be immunized. An adult smoker should be immunized. People with an immunocompromised condition and certain other conditions should receive both PCV13 and PPSV23 vaccines. People with human immunodeficiency virus (HIV) infection should be immunized as soon as possible after diagnosis. Immunization during chemotherapy or radiation therapy should be avoided. Routine use of PPSV23 vaccine is not recommended for American Indians, [place] Natives, or people younger than 65 years unless there are medical conditions that require PPSV23 vaccine. When indicated, people who have unknown immunization and have no record of immunization should receive PPSV23 vaccine. One-time revaccination 5 years after the first dose of PPSV23 is recommended for people aged 19-64 years who have chronic kidney failure, nephrotic syndrome, asplenia, or immunocompromised conditions. People who received 1-2 doses of PPSV23 before age 65 years should receive another dose of PPSV23 vaccine at age 65 years or later if at least 5 years have passed since the previous dose. Doses of PPSV23 are not needed for people immunized with PPSV23 at or after age 65 years.  Meningococcal vaccine. Adults with asplenia or persistent complement component deficiencies should receive 2 doses of quadrivalent meningococcal conjugate (MenACWY-D) vaccine. The doses should be obtained at least 2 months apart. Microbiologists working with certain meningococcal bacteria,  military recruits, people at risk during an outbreak, and people who travel to or live in countries with a high rate of meningitis should be immunized. A first-year college student up through age 21 years who is living in a residence hall should receive a dose if he did not receive a dose on or after his 16th birthday. Adults who have certain high-risk conditions should receive one or more doses of vaccine.  Hepatitis A vaccine. Adults who wish to be protected from this disease, have certain high-risk conditions, work with hepatitis A-infected animals, work in hepatitis A research labs, or   travel to or work in countries with a high rate of hepatitis A should be immunized. Adults who were previously unvaccinated and who anticipate close contact with an international adoptee during the first 60 days after arrival in the [place] from a country with a high rate of hepatitis A should be immunized.  Hepatitis B vaccine. Adults should be immunized if they wish to be protected from this disease, have certain high-risk conditions, may be exposed to blood or other infectious body fluids, are household contacts or sex partners of hepatitis B positive people, are clients or workers in certain care facilities, or travel to or work in countries with a high rate of hepatitis B.  Haemophilus influenzae type b (Hib) vaccine. A previously unvaccinated person with asplenia or sickle cell disease or having a scheduled splenectomy should receive 1 dose of Hib vaccine. Regardless of previous immunization, a recipient of a hematopoietic stem cell transplant should receive a 3-dose series 6-12 months after his successful transplant. Hib vaccine is not recommended for adults with HIV infection. Preventive Service / Frequency Ages 52 to 17  Blood pressure check.** / Every 1 to 2 years.  Lipid and cholesterol check.** / Every 5 years beginning at age 69.  Hepatitis C blood test.** / For any individual with known risks for  hepatitis C.  Skin self-exam. / Monthly.  Influenza vaccine. / Every year.  Tetanus, diphtheria, and acellular pertussis (Tdap, Td) vaccine.** / Consult your health care provider. 1 dose of Td every 10 years.  Varicella vaccine.** / Consult your health care provider.  HPV vaccine. / 3 doses over 6 months, if 72 or younger.  Measles, mumps, rubella (MMR) vaccine.** / You need at least 1 dose of MMR if you were born in 1957 or later. You may also need a second dose.  Pneumococcal 13-valent conjugate (PCV13) vaccine.** / Consult your health care provider.  Pneumococcal polysaccharide (PPSV23) vaccine.** / 1 to 2 doses if you smoke cigarettes or if you have certain conditions.  Meningococcal vaccine.** / 1 dose if you are age 35 to 60 years and a Market researcher living in a residence hall, or have one of several medical conditions. You may also need additional booster doses.  Hepatitis A vaccine.** / Consult your health care provider.  Hepatitis B vaccine.** / Consult your health care provider.  Haemophilus influenzae type b (Hib) vaccine.** / Consult your health care provider. Ages 35 to 8  Blood pressure check.** / Every 1 to 2 years.  Lipid and cholesterol check.** / Every 5 years beginning at age 57.  Lung cancer screening. / Every year if you are aged 44-80 years and have a 30-pack-year history of smoking and currently smoke or have quit within the past 15 years. Yearly screening is stopped once you have quit smoking for at least 15 years or develop a health problem that would prevent you from having lung cancer treatment.  Fecal occult blood test (FOBT) of stool. / Every year beginning at age 55 and continuing until age 73. You may not have to do this test if you get a colonoscopy every 10 years.  Flexible sigmoidoscopy** or colonoscopy.** / Every 5 years for a flexible sigmoidoscopy or every 10 years for a colonoscopy beginning at age 28 and continuing until age  1.  Hepatitis C blood test.** / For all people born from [birth year] through 1965 and any individual with known risks for hepatitis C.  Skin self-exam. / Monthly.  Influenza vaccine. / Every  year.  Tetanus, diphtheria, and acellular pertussis (Tdap/Td) vaccine.** / Consult your health care provider. 1 dose of Td every 10 years.  Varicella vaccine.** / Consult your health care provider.  Zoster vaccine.** / 1 dose for adults aged 44 years or older.  Measles, mumps, rubella (MMR) vaccine.** / You need at least 1 dose of MMR if you were born in 1957 or later. You may also need a second dose.  Pneumococcal 13-valent conjugate (PCV13) vaccine.** / Consult your health care provider.  Pneumococcal polysaccharide (PPSV23) vaccine.** / 1 to 2 doses if you smoke cigarettes or if you have certain conditions.  Meningococcal vaccine.** / Consult your health care provider.  Hepatitis A vaccine.** / Consult your health care provider.  Hepatitis B vaccine.** / Consult your health care provider.  Haemophilus influenzae type b (Hib) vaccine.** / Consult your health care provider. Ages 41 and over  Blood pressure check.** / Every 1 to 2 years.  Lipid and cholesterol check.**/ Every 5 years beginning at age 84.  Lung cancer screening. / Every year if you are aged 59-80 years and have a 30-pack-year history of smoking and currently smoke or have quit within the past 15 years. Yearly screening is stopped once you have quit smoking for at least 15 years or develop a health problem that would prevent you from having lung cancer treatment.  Fecal occult blood test (FOBT) of stool. / Every year beginning at age 44 and continuing until age 12. You may not have to do this test if you get a colonoscopy every 10 years.  Flexible sigmoidoscopy** or colonoscopy.** / Every 5 years for a flexible sigmoidoscopy or every 10 years for a colonoscopy beginning at age 106 and continuing until age 31.  Hepatitis C blood  test.** / For all people born from [birth year] through 1965 and any individual with known risks for hepatitis C.  Abdominal aortic aneurysm (AAA) screening.** / A one-time screening for ages 58 to 5 years who are current or former smokers.  Skin self-exam. / Monthly.  Influenza vaccine. / Every year.  Tetanus, diphtheria, and acellular pertussis (Tdap/Td) vaccine.** / 1 dose of Td every 10 years.  Varicella vaccine.** / Consult your health care provider.  Zoster vaccine.** / 1 dose for adults aged 70 years or older.  Pneumococcal 13-valent conjugate (PCV13) vaccine.** / Consult your health care provider.  Pneumococcal polysaccharide (PPSV23) vaccine.** / 1 dose for all adults aged 26 years and older.  Meningococcal vaccine.** / Consult your health care provider.  Hepatitis A vaccine.** / Consult your health care provider.  Hepatitis B vaccine.** / Consult your health care provider.  Haemophilus influenzae type b (Hib) vaccine.** / Consult your health care provider. **Family history and personal history of risk and conditions may change your health care provider's recommendations. Document Released: 03/12/2001 Document Revised: 01/19/2013 Document Reviewed: 06/11/2010 [facility] Patient Information 2015 [place], [place]. This information is not intended to replace advice given to you by your health care provider. Make sure you discuss any questions you have with your health care provider. Tdap Vaccine (Tetanus, Diphtheria, Pertussis): What You Need to Know 1. Why get vaccinated? Tetanus, diphtheria and pertussis can be very serious diseases, even for adolescents and adults. Tdap vaccine can protect [place] from these diseases. TETANUS (Lockjaw) causes painful muscle tightening and stiffness, usually all over the body.  It can lead to tightening of muscles in the head and neck so you can't open your mouth, swallow, or sometimes even breathe. Tetanus kills about 1  out of 5 people who are  infected. DIPHTHERIA can cause a thick coating to form in the back of the throat.  It can lead to breathing problems, paralysis, heart failure, and death. PERTUSSIS (Whooping Cough) causes severe coughing spells, which can cause difficulty breathing, vomiting and disturbed sleep.  It can also lead to weight loss, incontinence, and rib fractures. Up to 2 in 100 adolescents and 5 in 100 adults with pertussis are hospitalized or have complications, which could include pneumonia or death. These diseases are caused by bacteria. Diphtheria and pertussis are spread from person to person through coughing or sneezing. Tetanus enters the body through cuts, scratches, or wounds. Before vaccines, the [place] saw as many as 200,000 cases a year of diphtheria and pertussis, and hundreds of cases of tetanus. Since vaccination began, tetanus and diphtheria have dropped by about 99% and pertussis by about 80%. 2. Tdap vaccine Tdap vaccine can protect adolescents and adults from tetanus, diphtheria, and pertussis. One dose of Tdap is routinely given at age 28 or 22. People who did not get Tdap at that age should get it as soon as possible. Tdap is especially important for health care professionals and anyone having close contact with a baby younger than 12 months. Pregnant women should get a dose of Tdap during every pregnancy, to protect the newborn from pertussis. Infants are most at risk for severe, life-threatening complications from pertussis. A similar vaccine, called Td, protects from tetanus and diphtheria, but not pertussis. A Td booster should be given every 10 years. Tdap may be given as one of these boosters if you have not already gotten a dose. Tdap may also be given after a severe cut or burn to prevent tetanus infection. Your doctor can give you more information. Tdap may safely be given at the same time as other vaccines. 3. Some people should not get this vaccine  If you ever had a  life-threatening allergic reaction after a dose of any tetanus, diphtheria, or pertussis containing vaccine, OR if you have a severe allergy to any part of this vaccine, you should not get Tdap. Tell your doctor if you have any severe allergies.  If you had a coma, or long or multiple seizures within 7 days after a childhood dose of DTP or DTaP, you should not get Tdap, unless a cause other than the vaccine was found. You can still get Td.  Talk to your doctor if you:  have epilepsy or another nervous system problem,  had severe pain or swelling after any vaccine containing diphtheria, tetanus or pertussis,  ever had Guillain-Barr Syndrome (GBS),  aren't feeling well on the day the shot is scheduled. 4. Risks of a vaccine reaction With any medicine, including vaccines, there is a chance of side effects. These are usually mild and go away on their own, but serious reactions are also possible. Brief fainting spells can follow a vaccination, leading to injuries from falling. Sitting or lying down for about 15 minutes can help prevent these. Tell your doctor if you feel dizzy or light-headed, or have vision changes or ringing in the ears. Mild problems following Tdap (Did not interfere with activities)  Pain where the shot was given (about 3 in 4 adolescents or 2 in 3 adults)  Redness or swelling where the shot was given (about 1 person in 5)  Mild fever of at least 100.60F (up to about 1 in 25 adolescents or 1 in 100 adults)  Headache (about 3 or  4 people in 10)  Tiredness (about 1 person in 3 or 4)  Nausea, vomiting, diarrhea, stomach ache (up to 1 in 4 adolescents or 1 in 10 adults)  Chills, body aches, sore joints, rash, swollen glands (uncommon) Moderate problems following Tdap (Interfered with activities, but did not require medical attention)  Pain where the shot was given (about 1 in 5 adolescents or 1 in 100 adults)  Redness or swelling where the shot was given (up to about  1 in 16 adolescents or 1 in 25 adults)  Fever over 102F (about 1 in 100 adolescents or 1 in 250 adults)  Headache (about 3 in 20 adolescents or 1 in 10 adults)  Nausea, vomiting, diarrhea, stomach ache (up to 1 or 3 people in 100)  Swelling of the entire arm where the shot was given (up to about 3 in 100). Severe problems following Tdap (Unable to perform usual activities; required medical attention)  Swelling, severe pain, bleeding and redness in the arm where the shot was given (rare). A severe allergic reaction could occur after any vaccine (estimated less than 1 in a million doses). 5. What if there is a serious reaction? What should I look for?  Look for anything that concerns you, such as signs of a severe allergic reaction, very high fever, or behavior changes. Signs of a severe allergic reaction can include hives, swelling of the face and throat, difficulty breathing, a fast heartbeat, dizziness, and weakness. These would start a few minutes to a few hours after the vaccination. What should I do?  If you think it is a severe allergic reaction or other emergency that can't wait, call 9-1-1 or get the person to the nearest hospital. Otherwise, call your doctor.  Afterward, the reaction should be reported to the "Vaccine Adverse Event Reporting System" (VAERS). Your doctor might file this report, or you can do it yourself through the VAERS web site at www.vaers.SamedayNews.es, or by calling (814)137-5400. VAERS is only for reporting reactions. They do not give medical advice.  6. The National Vaccine Injury Compensation Program The [employer] Vaccine Injury Compensation Program (VICP) is a federal program that was created to compensate people who may have been injured by certain vaccines. Persons who believe they may have been injured by a vaccine can learn about the program and about filing a claim by calling (914) 766-0919 or visiting the [place] website at  GoldCloset.com.ee. 7. How can I learn more?  Ask your doctor.  Call your local or state health department.  Contact the Centers for Disease Control and Prevention (CDC):  Call 4238662018 or visit CDC's website at http://hunter.com/. CDC Tdap Vaccine VIS (06/06/11) Document Released: 07/16/2011 Document Revised: 05/31/2013 Document Reviewed: 04/28/2013 ExitCare Patient Information 2015 [place], [place]. This information is not intended to replace advice given to you by your health care provider. Make sure you discuss any questions you have with your health care provider. DASH Eating Plan DASH stands for "Dietary Approaches to Stop Hypertension." The DASH eating plan is a healthy eating plan that has been shown to reduce high blood pressure (hypertension). Additional health benefits may include reducing the risk of type 2 diabetes mellitus, heart disease, and stroke. The DASH eating plan may also help with weight loss. WHAT DO I NEED TO KNOW ABOUT THE DASH EATING PLAN? For the DASH eating plan, you will follow these general guidelines:  Choose foods with a percent daily value for sodium of less than 5% (as listed on the food label).  Use  salt-free seasonings or herbs instead of table salt or sea salt.  Check with your health care provider or pharmacist before using salt substitutes.  Eat lower-sodium products, often labeled as "lower sodium" or "no salt added."  Eat fresh foods.  Eat more vegetables, fruits, and low-fat dairy products.  Choose whole grains. Look for the word "whole" as the first word in the ingredient list.  Choose fish and skinless chicken or [place] more often than red meat. Limit fish, poultry, and meat to 6 oz (170 g) each day.  Limit sweets, desserts, sugars, and sugary drinks.  Choose heart-healthy fats.  Limit cheese to 1 oz (28 g) per day.  Eat more home-cooked food and less restaurant, buffet, and fast food.  Limit fried  foods.  Cook foods using methods other than frying.  Limit canned vegetables. If you do use them, rinse them well to decrease the sodium.  When eating at a restaurant, ask that your food be prepared with less salt, or no salt if possible. WHAT FOODS CAN I EAT? Seek help from a dietitian for individual calorie needs. Grains Whole grain or whole wheat bread. Brown rice. Whole grain or whole wheat pasta. Quinoa, bulgur, and whole grain cereals. Low-sodium cereals. Corn or whole wheat flour tortillas. Whole grain cornbread. Whole grain crackers. Low-sodium crackers. Vegetables Fresh or frozen vegetables (raw, steamed, roasted, or grilled). Low-sodium or reduced-sodium tomato and vegetable juices. Low-sodium or reduced-sodium tomato sauce and paste. Low-sodium or reduced-sodium canned vegetables.  Fruits All fresh, canned (in natural juice), or frozen fruits. Meat and Other Protein Products Ground beef (85% or leaner), grass-fed beef, or beef trimmed of fat. Skinless chicken or [place]. Ground chicken or [place]. Pork trimmed of fat. All fish and seafood. Eggs. Dried beans, peas, or lentils. Unsalted nuts and seeds. Unsalted canned beans. Dairy Low-fat dairy products, such as skim or 1% milk, 2% or reduced-fat cheeses, low-fat ricotta or cottage cheese, or plain low-fat yogurt. Low-sodium or reduced-sodium cheeses. Fats and Oils Tub margarines without trans fats. Light or reduced-fat mayonnaise and salad dressings (reduced sodium). Avocado. Safflower, olive, or canola oils. Natural peanut or almond butter. Other Unsalted popcorn and pretzels. The items listed above may not be a complete list of recommended foods or beverages. Contact your dietitian for more options. WHAT FOODS ARE NOT RECOMMENDED? Grains White bread. White pasta. White rice. Refined cornbread. Bagels and croissants. Crackers that contain trans fat. Vegetables Creamed or fried vegetables. Vegetables in a cheese sauce. Regular  canned vegetables. Regular canned tomato sauce and paste. Regular tomato and vegetable juices. Fruits Dried fruits. Canned fruit in light or heavy syrup. Fruit juice. Meat and Other Protein Products Fatty cuts of meat. Ribs, chicken wings, bacon, sausage, bologna, salami, chitterlings, fatback, hot dogs, bratwurst, and packaged luncheon meats. Salted nuts and seeds. Canned beans with salt. Dairy Whole or 2% milk, cream, half-and-half, and cream cheese. Whole-fat or sweetened yogurt. Full-fat cheeses or blue cheese. Nondairy creamers and whipped toppings. Processed cheese, cheese spreads, or cheese curds. Condiments Onion and garlic salt, seasoned salt, table salt, and sea salt. Canned and packaged gravies. Worcestershire sauce. Tartar sauce. Barbecue sauce. Teriyaki sauce. Soy sauce, including reduced sodium. Steak sauce. Fish sauce. Oyster sauce. Cocktail sauce. Horseradish. Ketchup and mustard. Meat flavorings and tenderizers. Bouillon cubes. Hot sauce. Tabasco sauce. Marinades. Taco seasonings. Relishes. Fats and Oils Butter, stick margarine, lard, shortening, ghee, and bacon fat. Coconut, palm kernel, or palm oils. Regular salad dressings. Other Pickles and olives. Salted popcorn and pretzels.  The items listed above may not be a complete list of foods and beverages to avoid. Contact your dietitian for more information. WHERE CAN I FIND MORE INFORMATION? National Heart, Lung, and Blood Institute: travelstabloid.com Document Released: 01/03/2011 Document Revised: 05/31/2013 Document Reviewed: 11/18/2012 [facility] Patient Information 2015 [place], [place]. This information is not intended to replace advice given to you by your health care provider. Make sure you discuss any questions you have with your health care provider.

## 2014-06-01 ENCOUNTER — Encounter: Payer: Self-pay | Admitting: Family Medicine

## 2014-06-01 ENCOUNTER — Ambulatory Visit (INDEPENDENT_AMBULATORY_CARE_PROVIDER_SITE_OTHER): Payer: Medicare Other

## 2014-06-01 ENCOUNTER — Ambulatory Visit (INDEPENDENT_AMBULATORY_CARE_PROVIDER_SITE_OTHER): Payer: Medicare Other | Admitting: Family Medicine

## 2014-06-01 VITALS — BP 130/72 | HR 67 | Temp 96.9°F | Ht 68.0 in | Wt 201.0 lb

## 2014-06-01 DIAGNOSIS — E8881 Metabolic syndrome: Secondary | ICD-10-CM

## 2014-06-01 DIAGNOSIS — E559 Vitamin D deficiency, unspecified: Secondary | ICD-10-CM | POA: Diagnosis not present

## 2014-06-01 DIAGNOSIS — N400 Enlarged prostate without lower urinary tract symptoms: Secondary | ICD-10-CM | POA: Diagnosis not present

## 2014-06-01 DIAGNOSIS — E785 Hyperlipidemia, unspecified: Secondary | ICD-10-CM

## 2014-06-01 DIAGNOSIS — R972 Elevated prostate specific antigen [PSA]: Secondary | ICD-10-CM

## 2014-06-01 DIAGNOSIS — I10 Essential (primary) hypertension: Secondary | ICD-10-CM | POA: Diagnosis not present

## 2014-06-01 LAB — POCT CBC
Granulocyte percent: 56.8 %G (ref 37–80)
HEMATOCRIT: 49.4 % (ref 43.5–53.7)
HEMOGLOBIN: 15.3 g/dL (ref 14.1–18.1)
LYMPH, POC: 2.2 (ref 0.6–3.4)
MCH: 31 pg (ref 27–31.2)
MCHC: 13.1 g/dL — AB (ref 31.8–35.4)
MCV: 100.3 fL — AB (ref 80–97)
MPV: 7.5 fL (ref 0–99.8)
POC Granulocyte: 3.5 (ref 2–6.9)
POC LYMPH PERCENT: 35.7 %L (ref 10–50)
Platelet Count, POC: 272 10*3/uL (ref 142–424)
RBC: 4.92 M/uL (ref 4.69–6.13)
RDW, POC: 272 %
WBC: 6.2 10*3/uL (ref 4.6–10.2)

## 2014-06-01 NOTE — Progress Notes (Signed)
Subjective:    Patient ID: Mario Powers, male    DOB: 1940-09-01, 74 y.o.   MRN: [id number]  HPI Pt here for follow up and management of chronic medical problems which includes hypertension and hyperlipidemia. He is taking medication regularly.      Patient Active Problem List   Diagnosis Date Noted  . Metabolic syndrome 05/11/2013  . Elevated blood sugar 01/13/2013  . Personal history of colonic polyps 01/13/2013  . Hyperlipidemia 07/22/2012  . Hypertension 07/22/2012  . BPH (benign prostatic hypertrophy) 07/22/2012  . Vitamin D deficiency 07/22/2012   Outpatient Encounter Prescriptions as of 06/01/2014  Medication Sig  . aspirin 81 MG tablet Take 81 mg by mouth daily.  Marland Kitchen atorvastatin (LIPITOR) 80 MG tablet Take 0.5 tablets (40 mg total) by mouth daily. As directed  . Cholecalciferol (VITAMIN D) 2000 UNITS CAPS Take 1 capsule by mouth daily.  . fish oil-omega-3 fatty acids 1000 MG capsule Take 2 g by mouth daily.  Marland Kitchen losartan-hydrochlorothiazide (HYZAAR) 100-12.5 MG per tablet TAKE 1 TABLET BY MOUTH ONCE A DAY  . Multiple Vitamin (MULTIVITAMIN) tablet Take 1 tablet by mouth daily.  . niacin (NIASPAN) 1000 MG CR tablet TAKE 3 TABLETS BY MOUTH AT BEDTIME  . pioglitazone (ACTOS) 30 MG tablet TAKE 1 TABLET BY MOUTH EVERY DAY  . [DISCONTINUED] atorvastatin (LIPITOR) 80 MG tablet TAKE 1 TABLET BY MOUTH DAILY.   No facility-administered encounter medications on file as of 06/01/2014.     Review of Systems  Constitutional: Negative.   HENT: Negative.   Eyes: Negative.   Respiratory: Negative.   Cardiovascular: Negative.   Gastrointestinal: Negative.   Endocrine: Negative.   Genitourinary: Negative.   Musculoskeletal: Negative.   Skin: Negative.   Allergic/Immunologic: Negative.   Neurological: Negative.   Hematological: Negative.   Psychiatric/Behavioral: Negative.        Objective:   Physical Exam  Constitutional: He is oriented to person, place, and time. He  appears well-developed and well-nourished.  The patient is alert and relaxed and pleasant.  HENT:  Head: Normocephalic and atraumatic.  Right Ear: External ear normal.  Left Ear: External ear normal.  Nose: Nose normal.  Mouth/Throat: Oropharynx is clear and moist. No oropharyngeal exudate.  Eyes: Conjunctivae and EOM are normal. Pupils are equal, round, and reactive to light. Right eye exhibits no discharge. Left eye exhibits no discharge. No scleral icterus.  Neck: Normal range of motion. Neck supple. No thyromegaly present.  No carotid bruits or anterior cervical adenopathy or thyromegaly.  Cardiovascular: Normal rate, regular rhythm, normal heart sounds and intact distal pulses.  Exam reveals no gallop and no friction rub.   No murmur heard. At 72/m  Pulmonary/Chest: Effort normal and breath sounds normal. No respiratory distress. He has no wheezes. He has no rales. He exhibits no tenderness.  No axillary adenopathy  Abdominal: Soft. Bowel sounds are normal. He exhibits no mass. There is no tenderness. There is no rebound and no guarding.  Without tenderness masses or organ enlargement  Musculoskeletal: Normal range of motion. He exhibits no edema or tenderness.  Lymphadenopathy:    He has no cervical adenopathy.  Neurological: He is alert and oriented to person, place, and time. He has normal reflexes. No cranial nerve deficit.  Skin: Skin is warm and dry. No rash noted.  Psychiatric: He has a normal mood and affect. His behavior is normal. Judgment and thought content normal.  Nursing note and vitals reviewed.  BP 130/72 mmHg  Pulse 67  Temp(Src) 96.9 F (36.1 C) (Oral)  Ht $R'5\' 8"'JN$  (1.727 m)  Wt 201 lb (91.173 kg)  BMI 30.57 kg/m2  [facility] reading (PRIMARY) by  Dr. Brunilda Payor x-ray--no active disease                                        Assessment & Plan:  1. BPH (benign prostatic hypertrophy) -No problems with voiding and the patient's significant history is a he has  PSAs which seemed to go up and down and the most recent one was improved in February. - POCT CBC - Fecal occult blood, imunochemical  2. Essential hypertension -The blood pressure is good today he should continue with current treatment. - POCT CBC - BMP8+EGFR - Hepatic function panel - DG Chest 2 View; Future - Fecal occult blood, imunochemical  3. Hyperlipidemia -The patient should continue with current treatment pending results of lab work to be done today. - POCT CBC - NMR, lipoprofile - DG Chest 2 View; Future - Fecal occult blood, imunochemical  4. Vitamin D deficiency -Continue current treatment pending results of lab work - POCT CBC - Vit D  25 hydroxy (rtn osteoporosis monitoring) - Fecal occult blood, imunochemical  5. Elevated PSA -We will not check this again today but will check it at a time in the future. - POCT CBC - Fecal occult blood, imunochemical  6. Metabolic syndrome -The patient should continue to work aggressively on his diet with exercise and weight loss - POCT CBC - Fecal occult blood, imunochemical  Patient Instructions                       Medicare Annual Wellness Visit  [facility] and the medical providers at [facility] strive to bring you the best medical care.  In doing so we not only want to address your current medical conditions and concerns but also to detect new conditions early and prevent illness, disease and health-related problems.    Medicare offers a yearly Wellness Visit which allows our clinical staff to assess your need for preventative services including immunizations, lifestyle education, counseling to decrease risk of preventable diseases and screening for fall risk and other medical concerns.    This visit is provided free of charge (no copay) for all Medicare recipients. The clinical pharmacists at [facility] have begun to conduct these Wellness Visits which will also include a  thorough review of all your medications.    As you primary medical provider recommend that you make an appointment for your Annual Wellness Visit if you have not done so already this year.  You may set up this appointment before you leave today or you may call back ([phone]) and schedule an appointment.  Please make sure when you call that you mention that you are scheduling your Annual Wellness Visit with the clinical pharmacist so that the appointment may be made for the proper length of time.     Continue current medications. Continue good therapeutic lifestyle changes which include good diet and exercise. Fall precautions discussed with patient. If an FOBT was given today- please return it to our front desk. If you are over 37 years old - you may need Prevnar 39 or the adult Pneumonia vaccine.  Flu Shots are still available at our office. If you still haven't had one please call to set up a  nurse visit to get one.   After your visit with [place] today you will receive a survey in the mail or online from [employer] regarding your care with [place]. Please take a moment to fill this out. Your feedback is very important to [place] as you can help [place] better understand your patient needs as well as improve your experience and satisfaction. WE CARE ABOUT YOU!!!   We will call you with your lab work results and chest x-ray results as soon as those results become available Drink plenty of fluids Always be careful and did not put yourself at risk for falling   Arrie Senate MD

## 2014-06-01 NOTE — Patient Instructions (Addendum)
Medicare Annual Wellness Visit  [facility] and the medical providers at [facility] strive to bring you the best medical care.  In doing so we not only want to address your current medical conditions and concerns but also to detect new conditions early and prevent illness, disease and health-related problems.    Medicare offers a yearly Wellness Visit which allows our clinical staff to assess your need for preventative services including immunizations, lifestyle education, counseling to decrease risk of preventable diseases and screening for fall risk and other medical concerns.    This visit is provided free of charge (no copay) for all Medicare recipients. The clinical pharmacists at [facility] have begun to conduct these Wellness Visits which will also include a thorough review of all your medications.    As you primary medical provider recommend that you make an appointment for your Annual Wellness Visit if you have not done so already this year.  You may set up this appointment before you leave today or you may call back ([phone]) and schedule an appointment.  Please make sure when you call that you mention that you are scheduling your Annual Wellness Visit with the clinical pharmacist so that the appointment may be made for the proper length of time.     Continue current medications. Continue good therapeutic lifestyle changes which include good diet and exercise. Fall precautions discussed with patient. If an FOBT was given today- please return it to our front desk. If you are over 34 years old - you may need Prevnar 20 or the adult Pneumonia vaccine.  Flu Shots are still available at our office. If you still haven't had one please call to set up a nurse visit to get one.   After your visit with [place] today you will receive a survey in the mail or online from [employer] regarding your care with [place]. Please take a moment to  fill this out. Your feedback is very important to [place] as you can help [place] better understand your patient needs as well as improve your experience and satisfaction. WE CARE ABOUT YOU!!!   We will call you with your lab work results and chest x-ray results as soon as those results become available Drink plenty of fluids Always be careful and did not put yourself at risk for falling

## 2014-06-02 LAB — NMR, LIPOPROFILE
Cholesterol: 124 mg/dL (ref 100–199)
HDL Cholesterol by NMR: 55 mg/dL (ref >39)
HDL PARTICLE NUMBER: 34.2 umol/L (ref >=30.5)
LDL Particle Number: 742 nmol/L (ref <1000)
LDL Size: 20.1 nm (ref >20.5)
LDL-C: 57 mg/dL (ref 0–99)
LP-IR SCORE: 35 (ref <=45)
Small LDL Particle Number: 399 nmol/L (ref <=527)
Triglycerides by NMR: 62 mg/dL (ref 0–149)

## 2014-06-02 LAB — HEPATIC FUNCTION PANEL
ALBUMIN: 4.1 g/dL (ref 3.5–4.8)
ALK PHOS: 74 IU/L (ref 39–117)
ALT: 20 IU/L (ref 0–44)
AST: 27 IU/L (ref 0–40)
BILIRUBIN TOTAL: 1.2 mg/dL (ref 0.0–1.2)
Bilirubin, Direct: 0.31 mg/dL (ref 0.00–0.40)
Total Protein: 6.8 g/dL (ref 6.0–8.5)

## 2014-06-02 LAB — BMP8+EGFR
BUN / CREAT RATIO: 15 (ref 10–22)
BUN: 18 mg/dL (ref 8–27)
CO2: 24 mmol/L (ref 18–29)
CREATININE: 1.23 mg/dL (ref 0.76–1.27)
Calcium: 9 mg/dL (ref 8.6–10.2)
Chloride: 99 mmol/L (ref 97–108)
GFR, EST AFRICAN AMERICAN: 67 mL/min/{1.73_m2} (ref >59)
GFR, EST NON AFRICAN AMERICAN: 58 mL/min/{1.73_m2} — AB (ref >59)
GLUCOSE: 104 mg/dL — AB (ref 65–99)
Potassium: 4.5 mmol/L (ref 3.5–5.2)
Sodium: 137 mmol/L (ref 134–144)

## 2014-06-02 LAB — VITAMIN D 25 HYDROXY (VIT D DEFICIENCY, FRACTURES): VIT D 25 HYDROXY: 46.3 ng/mL (ref 30.0–100.0)

## 2014-06-03 LAB — FECAL OCCULT BLOOD, IMMUNOCHEMICAL: Fecal Occult Bld: POSITIVE — AB

## 2014-07-04 ENCOUNTER — Other Ambulatory Visit: Payer: Medicare Other

## 2014-07-04 DIAGNOSIS — Z1212 Encounter for screening for malignant neoplasm of rectum: Secondary | ICD-10-CM | POA: Diagnosis not present

## 2014-07-04 NOTE — Progress Notes (Signed)
Lab only 

## 2014-07-06 LAB — FECAL OCCULT BLOOD, IMMUNOCHEMICAL: Fecal Occult Bld: NEGATIVE

## 2014-07-11 ENCOUNTER — Other Ambulatory Visit: Payer: Self-pay | Admitting: Family Medicine

## 2014-08-05 DIAGNOSIS — W230XXA Caught, crushed, jammed, or pinched between moving objects, initial encounter: Secondary | ICD-10-CM | POA: Diagnosis not present

## 2014-08-05 DIAGNOSIS — Z7982 Long term (current) use of aspirin: Secondary | ICD-10-CM | POA: Diagnosis not present

## 2014-08-05 DIAGNOSIS — S81812A Laceration without foreign body, left lower leg, initial encounter: Secondary | ICD-10-CM | POA: Diagnosis not present

## 2014-08-07 DIAGNOSIS — S81812D Laceration without foreign body, left lower leg, subsequent encounter: Secondary | ICD-10-CM | POA: Diagnosis not present

## 2014-08-07 DIAGNOSIS — Z4800 Encounter for change or removal of nonsurgical wound dressing: Secondary | ICD-10-CM | POA: Diagnosis not present

## 2014-08-07 DIAGNOSIS — Z4801 Encounter for change or removal of surgical wound dressing: Secondary | ICD-10-CM | POA: Diagnosis not present

## 2014-08-08 DIAGNOSIS — Z4800 Encounter for change or removal of nonsurgical wound dressing: Secondary | ICD-10-CM | POA: Diagnosis not present

## 2014-08-08 DIAGNOSIS — S81819D Laceration without foreign body, unspecified lower leg, subsequent encounter: Secondary | ICD-10-CM | POA: Diagnosis not present

## 2014-08-10 DIAGNOSIS — Z7982 Long term (current) use of aspirin: Secondary | ICD-10-CM | POA: Diagnosis not present

## 2014-08-10 DIAGNOSIS — S81812D Laceration without foreign body, left lower leg, subsequent encounter: Secondary | ICD-10-CM | POA: Diagnosis not present

## 2014-08-10 DIAGNOSIS — I10 Essential (primary) hypertension: Secondary | ICD-10-CM | POA: Diagnosis not present

## 2014-08-15 DIAGNOSIS — L089 Local infection of the skin and subcutaneous tissue, unspecified: Secondary | ICD-10-CM | POA: Diagnosis not present

## 2014-08-15 DIAGNOSIS — Z4802 Encounter for removal of sutures: Secondary | ICD-10-CM | POA: Diagnosis not present

## 2014-09-09 ENCOUNTER — Encounter: Payer: Self-pay | Admitting: Family Medicine

## 2014-09-09 ENCOUNTER — Ambulatory Visit (INDEPENDENT_AMBULATORY_CARE_PROVIDER_SITE_OTHER): Payer: Medicare Other | Admitting: Family Medicine

## 2014-09-09 VITALS — BP 141/71 | HR 81 | Temp 97.8°F | Ht 68.0 in | Wt 196.0 lb

## 2014-09-09 DIAGNOSIS — S81832D Puncture wound without foreign body, left lower leg, subsequent encounter: Secondary | ICD-10-CM | POA: Diagnosis not present

## 2014-09-09 DIAGNOSIS — S81832A Puncture wound without foreign body, left lower leg, initial encounter: Secondary | ICD-10-CM

## 2014-09-09 NOTE — Patient Instructions (Signed)
Skin Tear Care  A skin tear is a wound in which the top layer of skin has peeled off. This is a common problem with aging because the skin becomes thinner and more fragile as a person gets older. In addition, some medicines, such as oral corticosteroids, can lead to skin thinning if taken for long periods of time.   A skin tear is often repaired with tape or skin adhesive strips. This keeps the skin that has been peeled off in contact with the healthier skin beneath. Depending on the location of the wound, a bandage (dressing) may be applied over the tape or skin adhesive strips. Sometimes, during the healing process, the skin turns black and dies. Even when this happens, the torn skin acts as a good dressing until the skin underneath gets healthier and repairs itself.  HOME CARE INSTRUCTIONS   · Change dressings once per day or as directed by your caregiver.  ¨ Gently clean the skin tear and the area around the tear using saline solution or mild soap and water.  ¨ Do not rub the injured skin dry. Let the area air dry.  ¨ Apply petroleum jelly or an antibiotic cream or ointment to keep the tear moist. This will help the wound heal. Do not allow a scab to form.  ¨ If the dressing sticks before the next dressing change, moisten it with warm soapy water and gently remove it.  · Protect the injured skin until it has healed.  · Only take over-the-counter or prescription medicines as directed by your caregiver.  · Take showers or baths using warm soapy water. Apply a new dressing after the shower or bath.  · Keep all follow-up appointments as directed by your caregiver.    SEEK IMMEDIATE MEDICAL CARE IF:   · You have redness, swelling, or increasing pain in the skin tear.  · You have pus coming from the skin tear.  · You have chills.  · You have a red streak that goes away from the skin tear.  · You have a bad smell coming from the tear or dressing.  · You have a fever or persistent symptoms for more than 2-3 days.  · You  have a fever and your symptoms suddenly get worse.  MAKE SURE YOU:  · Understand these instructions.  · Will watch this condition.  · Will get help right away if your child is not doing well or gets worse.  Document Released: 10/09/2000 Document Revised: 10/09/2011 Document Reviewed: 07/29/2011  ExitCare® Patient Information ©2015 [employer]. This information is not intended to replace advice given to you by your health care provider. Make sure you discuss any questions you have with your health care provider.

## 2014-09-09 NOTE — Progress Notes (Signed)
BP 141/71 mmHg  Pulse 81  Temp(Src) 97.8 F (36.6 C) (Oral)  Ht 5\' 8"  (1.727 m)  Wt 196 lb (88.905 kg)  BMI 29.81 kg/m2   Subjective:    Patient ID: Mario Powers, male    DOB: [birth year], 74 y.o.   MRN: [id number]  HPI: Mario Powers is a 74 y.o. male presenting on 09/09/2014 for Wound Check   HPI Left leg wound Patient has lower left leg wound on the lateral side that he got when he fell and punctured his leg on something metal. This occurred 5 weeks ago, and he went to the emergency room at that time. Per patient the wound went down to his bone. He has been going back to the ER every week for the past 4 weeks until this week for wound care. This week the ER told him to go see his primary care physician which is why he is here today. He has been putting petroleum jelly and a bandage on his wound at night but then leaving it open and dry during the day. He is a diabetic on Actos but does not know his control because he does not do home monitoring yet. The wound does not have any purulent drainage, and there is tenderness but only to touch. The wound has been consistently improving per patient.  Relevant past medical, surgical, family and social history reviewed and updated as indicated. Interim medical history since our last visit reviewed. Allergies and medications reviewed and updated.  Review of Systems  Constitutional: Negative for fever.  HENT: Negative for ear discharge and ear pain.   Eyes: Negative for discharge and visual disturbance.  Respiratory: Negative for shortness of breath and wheezing.   Cardiovascular: Negative for chest pain and leg swelling.  Gastrointestinal: Negative for abdominal pain, diarrhea and constipation.  Genitourinary: Negative for difficulty urinating.  Musculoskeletal: Negative for back pain and gait problem.  Skin: Positive for wound (left lower leg wound). Negative for rash.  Neurological: Negative for syncope, light-headedness and  headaches.  All other systems reviewed and are negative.   Per HPI unless specifically indicated above     Medication List       This list is accurate as of: 09/09/14  5:11 PM.  Always use your most recent med list.               aspirin 81 MG tablet  Take 81 mg by mouth daily.     atorvastatin 80 MG tablet  Commonly known as:  LIPITOR  Take 0.5 tablets (40 mg total) by mouth daily. As directed     fish oil-omega-3 fatty acids 1000 MG capsule  Take 2 g by mouth daily.     losartan-hydrochlorothiazide 100-12.5 MG per tablet  Commonly known as:  HYZAAR  TAKE 1 TABLET BY MOUTH ONCE A DAY     multivitamin tablet  Take 1 tablet by mouth daily.     niacin 1000 MG CR tablet  Commonly known as:  NIASPAN  TAKE 3 TABLETS BY MOUTH AT BEDTIME     pioglitazone 30 MG tablet  Commonly known as:  ACTOS  TAKE 1 TABLET BY MOUTH EVERY DAY     Vitamin D 2000 UNITS Caps  Take 1 capsule by mouth daily.           Objective:    BP 141/71 mmHg  Pulse 81  Temp(Src) 97.8 F (36.6 C) (Oral)  Ht 5\' 8"  (1.727 m)  Wt 196 lb (  88.905 kg)  BMI 29.81 kg/m2  Wt Readings from Last 3 Encounters:  09/09/14 196 lb (88.905 kg)  06/01/14 201 lb (91.173 kg)  05/31/14 208 lb (94.348 kg)    Physical Exam  Constitutional: He is oriented to person, place, and time. He appears well-developed and well-nourished. No distress.  Eyes: Conjunctivae and EOM are normal. Pupils are equal, round, and reactive to light. Right eye exhibits no discharge. No scleral icterus.  Cardiovascular: Normal rate, regular rhythm, normal heart sounds and intact distal pulses.   No murmur heard. Pulmonary/Chest: Effort normal and breath sounds normal. No respiratory distress. He has no wheezes.  Abdominal: He exhibits no distension.  Musculoskeletal: Normal range of motion. He exhibits no edema.  Neurological: He is alert and oriented to person, place, and time. Coordination normal.  Skin: Skin is warm and dry.  Laceration noted. No rash noted. He is not diaphoretic.     Psychiatric: He has a normal mood and affect. His behavior is normal.  Vitals reviewed.   Results for orders placed or performed in visit on 07/04/14  Fecal occult blood, imunochemical  Result Value Ref Range   Fecal Occult Bld Negative Negative      Assessment & Plan:   Problem List Items Addressed This Visit      Other   Puncture wound of lower leg, left - Primary   Relevant Orders   DME Other see comment       Follow up plan: Return in about 1 week (around 09/16/2014), or if symptoms worsen or fail to improve.  Caryl Pina, MD [facility] 09/09/2014, 5:11 PM

## 2014-09-13 DIAGNOSIS — S81832A Puncture wound without foreign body, left lower leg, initial encounter: Secondary | ICD-10-CM | POA: Diagnosis not present

## 2014-09-16 ENCOUNTER — Encounter: Payer: Self-pay | Admitting: Family Medicine

## 2014-09-16 ENCOUNTER — Telehealth: Payer: Self-pay | Admitting: Family Medicine

## 2014-09-16 ENCOUNTER — Ambulatory Visit (INDEPENDENT_AMBULATORY_CARE_PROVIDER_SITE_OTHER): Payer: Medicare Other | Admitting: Family Medicine

## 2014-09-16 VITALS — BP 121/65 | HR 71 | Temp 97.5°F | Ht 68.0 in | Wt 196.6 lb

## 2014-09-16 DIAGNOSIS — S81832D Puncture wound without foreign body, left lower leg, subsequent encounter: Secondary | ICD-10-CM | POA: Diagnosis not present

## 2014-09-16 NOTE — Progress Notes (Signed)
BP 121/65 mmHg  Pulse 71  Temp(Src) 97.5 F (36.4 C) (Oral)  Ht 5\' 8"  (1.727 m)  Wt 196 lb 9.6 oz (89.177 kg)  BMI 29.90 kg/m2   Subjective:    Patient ID: Mario Powers, male    DOB: 24-Jul-1940, 74 y.o.   MRN: [id number]  HPI: Mario Powers is a 73 y.o. male presenting on 09/16/2014 for Wound Care   HPI Leg wound recheck Patient presents today for one-week recheck on his wound care and for dressing change. He was able to get the Mepilex through order from his pharmacy. The wound has been improving with dressing changes. Patient has been putting Xeroform with a 4 x 4 pad on top of it. He denies any pain, or purulent discharge or new redness. Denies any fevers or chills.  Relevant past medical, surgical, family and social history reviewed and updated as indicated. Interim medical history since our last visit reviewed. Allergies and medications reviewed and updated.  Review of Systems  Constitutional: Negative for fever.  HENT: Negative for ear discharge and ear pain.   Eyes: Negative for discharge and visual disturbance.  Respiratory: Negative for shortness of breath and wheezing.   Cardiovascular: Negative for chest pain and leg swelling.  Gastrointestinal: Negative for abdominal pain, diarrhea and constipation.  Genitourinary: Negative for difficulty urinating.  Musculoskeletal: Negative for back pain and gait problem.  Skin: Positive for wound (left lower leg wound). Negative for rash.  Neurological: Negative for syncope, light-headedness and headaches.  All other systems reviewed and are negative.   Per HPI unless specifically indicated above     Medication List       This list is accurate as of: 09/16/14 11:25 AM.  Always use your most recent med list.               aspirin 81 MG tablet  Take 81 mg by mouth daily.     atorvastatin 80 MG tablet  Commonly known as:  LIPITOR  Take 0.5 tablets (40 mg total) by mouth daily. As directed     fish  oil-omega-3 fatty acids 1000 MG capsule  Take 2 g by mouth daily.     losartan-hydrochlorothiazide 100-12.5 MG per tablet  Commonly known as:  HYZAAR  TAKE 1 TABLET BY MOUTH ONCE A DAY     multivitamin tablet  Take 1 tablet by mouth daily.     niacin 1000 MG CR tablet  Commonly known as:  NIASPAN  TAKE 3 TABLETS BY MOUTH AT BEDTIME     pioglitazone 30 MG tablet  Commonly known as:  ACTOS  TAKE 1 TABLET BY MOUTH EVERY DAY     Vitamin D 2000 UNITS Caps  Take 1 capsule by mouth daily.           Objective:    BP 121/65 mmHg  Pulse 71  Temp(Src) 97.5 F (36.4 C) (Oral)  Ht 5\' 8"  (1.727 m)  Wt 196 lb 9.6 oz (89.177 kg)  BMI 29.90 kg/m2  Wt Readings from Last 3 Encounters:  09/16/14 196 lb 9.6 oz (89.177 kg)  09/09/14 196 lb (88.905 kg)  06/01/14 201 lb (91.173 kg)    Physical Exam  Constitutional: He is oriented to person, place, and time. He appears well-developed and well-nourished. No distress.  Eyes: Conjunctivae are normal. Right eye exhibits no discharge. No scleral icterus.  Cardiovascular: Normal rate, regular rhythm, normal heart sounds and intact distal pulses.   No murmur heard. Pulmonary/Chest: Effort normal  and breath sounds normal. No respiratory distress. He has no wheezes.  Abdominal: He exhibits no distension.  Musculoskeletal: Normal range of motion. He exhibits no edema.  Neurological: He is alert and oriented to person, place, and time. Coordination normal.  Skin: Skin is warm and dry. Laceration noted. No rash noted. He is not diaphoretic.     Psychiatric: He has a normal mood and affect. His behavior is normal.  Vitals reviewed.   Results for orders placed or performed in visit on 07/04/14  Fecal occult blood, imunochemical  Result Value Ref Range   Fecal Occult Bld Negative Negative      Assessment & Plan:   Problem List Items Addressed This Visit      Other   Puncture wound of lower leg, left - Primary    Patient comes in today for  recheck on left lower leg wound. He has been using Xeroform with the gauze pad over top. Wound is improved. Ordered Mepilex and patient brings today with himself, applied Mepilex here in office. Patient is going on vacation for a week will return for recheck to office when he is back.         Dressing change: Half of a Mepilex pad that he brought in was applied to the wound with an adhesive 4 x 8 pad placed over top. No debridement warranted today.  Follow up plan: Return in about 1 week (around 09/23/2014), or if symptoms worsen or fail to improve.  Caryl Pina, MD [facility] 09/16/2014, 11:25 AM

## 2014-09-16 NOTE — Telephone Encounter (Signed)
Xeroform dressing was put on this morning, pt instructed to leave on for three days. He was wondering about showers, instructed him to keep it covered up and as dry as possible like as if it is a cast.

## 2014-09-16 NOTE — Assessment & Plan Note (Addendum)
Patient comes in today for recheck on left lower leg wound. He has been using Xeroform with the gauze pad over top. Wound is improved. Ordered Mepilex and patient brings today with himself, applied Mepilex here in office. Patient is going on vacation for a week will return for recheck to office when he is back.

## 2014-09-16 NOTE — Patient Instructions (Signed)
Dressing Change °A dressing is a material placed over wounds. It keeps the wound clean, dry, and protected from further injury. This provides an environment that favors wound healing.  °BEFORE YOU BEGIN °· Get your supplies together. Things you may need include: °¨ Saline solution. °¨ Flexible gauze dressing. °¨ Medicated cream. °¨ Tape. °¨ Gloves. °¨ Abdominal dressing pads. °¨ Gauze squares. °¨ Plastic bags. °· Take pain medicine 30 minutes before the dressing change if you need it. °· Take a shower before you do the first dressing change of the day. Use plastic wrap or a plastic bag to prevent the dressing from getting wet. °REMOVING YOUR OLD DRESSING  °· Wash your hands with soap and water. Dry your hands with a clean towel. °· Put on your gloves. °· Remove any tape. °· Carefully remove the old dressing. If the dressing sticks, you may dampen it with warm water to loosen it, or follow your caregiver's specific directions. °· Remove any gauze or packing tape that is in your wound. °· Take off your gloves. °· Put the gloves, tape, gauze, or any packing tape into a plastic bag. °CHANGING YOUR DRESSING °· Open the supplies. °· Take the cap off the saline solution. °· Open the gauze package so that the gauze remains on the inside of the package. °· Put on your gloves. °· Clean your wound as told by your caregiver. °· If you have been told to keep your wound dry, follow those instructions. °· Your caregiver may tell you to do one or more of the following: °¨ Pick up the gauze. Pour the saline solution over the gauze. Squeeze out the extra saline solution. °¨ Put medicated cream or other medicine on your wound if you have been told to do so. °¨ Put the solution soaked gauze only in your wound, not on the skin around it. °¨ Pack your wound loosely or as told by your caregiver. °¨ Put dry gauze on your wound. °¨ Put abdominal dressing pads over the dry gauze if your wet gauze soaks through. °· Tape the abdominal dressing  pads in place so they will not fall off. Do not wrap the tape completely around the affected part (arm, leg, abdomen). °· Wrap the dressing pads with a flexible gauze dressing to secure it in place. °· Take off your gloves. Put them in the plastic bag with the old dressing. Tie the bag shut and throw it away. °· Keep the dressing clean and dry until your next dressing change. °· Wash your hands. °SEEK MEDICAL CARE IF: °· Your skin around the wound looks red. °· Your wound feels more tender or sore. °· You see pus in the wound. °· Your wound smells bad. °· You have a fever. °· Your skin around the wound has a rash that itches and burns. °· You see black or yellow skin in your wound that was not there before. °· You feel nauseous, throw up, and feel very tired. °Document Released: 02/22/2004 Document Revised: 04/08/2011 Document Reviewed: 11/26/2010 °ExitCare® Patient Information ©2015 [employer]. This information is not intended to replace advice given to you by your health care provider. Make sure you discuss any questions you have with your health care provider. ° °

## 2014-09-28 ENCOUNTER — Encounter: Payer: Self-pay | Admitting: Family Medicine

## 2014-09-28 ENCOUNTER — Ambulatory Visit (INDEPENDENT_AMBULATORY_CARE_PROVIDER_SITE_OTHER): Payer: Medicare Other | Admitting: Family Medicine

## 2014-09-28 VITALS — BP 129/66 | HR 67 | Temp 97.1°F | Ht 68.0 in | Wt 198.8 lb

## 2014-09-28 DIAGNOSIS — S81832D Puncture wound without foreign body, left lower leg, subsequent encounter: Secondary | ICD-10-CM | POA: Diagnosis not present

## 2014-09-28 NOTE — Progress Notes (Signed)
BP 129/66 mmHg  Pulse 67  Temp(Src) 97.1 F (36.2 C) (Oral)  Ht 5\' 8"  (1.727 m)  Wt 198 lb 12.8 oz (90.175 kg)  BMI 30.23 kg/m2   Subjective:    Patient ID: Mario Powers, male    DOB: [birth year], 74 y.o.   MRN: [id number]  HPI: Mario Powers is a 74 y.o. male presenting on 09/28/2014 for Follow up left leg wound   HPI Wound care recheck Patient comes in for wound recheck today, he is still doing the Mepilex on it and change it every 3 days. With the gauze pad over top. The wound continues to heal and progress in the right direction. He was last here 2 weeks ago and has been doing well with this wound care. Denies any fevers or chills, no purulent drainage, no erythema or redness around it.  Relevant past medical, surgical, family and social history reviewed and updated as indicated. Interim medical history since our last visit reviewed. Allergies and medications reviewed and updated.  Review of Systems  Constitutional: Negative for fever.  HENT: Negative for ear discharge and ear pain.   Eyes: Negative for discharge and visual disturbance.  Respiratory: Negative for shortness of breath and wheezing.   Cardiovascular: Negative for chest pain and leg swelling.  Gastrointestinal: Negative for abdominal pain, diarrhea and constipation.  Genitourinary: Negative for difficulty urinating.  Musculoskeletal: Negative for back pain and gait problem.  Skin: Positive for wound (left lower leg wound). Negative for rash.  Neurological: Negative for syncope, light-headedness and headaches.  All other systems reviewed and are negative.   Per HPI unless specifically indicated above     Medication List       This list is accurate as of: 09/28/14 10:58 AM.  Always use your most recent med list.               aspirin 81 MG tablet  Take 81 mg by mouth daily.     atorvastatin 80 MG tablet  Commonly known as:  LIPITOR  Take 0.5 tablets (40 mg total) by mouth daily. As directed       Fish Oil 1200 MG Caps  Take 1,200 mg by mouth 2 (two) times daily.     losartan-hydrochlorothiazide 100-12.5 MG per tablet  Commonly known as:  HYZAAR  TAKE 1 TABLET BY MOUTH ONCE A DAY     multivitamin tablet  Take 1 tablet by mouth daily.     niacin 1000 MG CR tablet  Commonly known as:  NIASPAN  TAKE 3 TABLETS BY MOUTH AT BEDTIME     pioglitazone 30 MG tablet  Commonly known as:  ACTOS  TAKE 1 TABLET BY MOUTH EVERY DAY     Vitamin D 2000 UNITS Caps  Take 1 capsule by mouth daily.           Objective:    BP 129/66 mmHg  Pulse 67  Temp(Src) 97.1 F (36.2 C) (Oral)  Ht 5\' 8"  (1.727 m)  Wt 198 lb 12.8 oz (90.175 kg)  BMI 30.23 kg/m2  Wt Readings from Last 3 Encounters:  09/28/14 198 lb 12.8 oz (90.175 kg)  09/16/14 196 lb 9.6 oz (89.177 kg)  09/09/14 196 lb (88.905 kg)    Physical Exam  Constitutional: He is oriented to person, place, and time. He appears well-developed and well-nourished. No distress.  Eyes: Conjunctivae are normal. Right eye exhibits no discharge. No scleral icterus.  Cardiovascular: Normal rate, regular rhythm, normal heart sounds and  intact distal pulses.   No murmur heard. Pulmonary/Chest: Effort normal and breath sounds normal. No respiratory distress. He has no wheezes.  Abdominal: He exhibits no distension.  Musculoskeletal: Normal range of motion. He exhibits no edema.  Neurological: He is alert and oriented to person, place, and time. Coordination normal.  Skin: Skin is warm and dry. Laceration noted. No rash noted. He is not diaphoretic.     Psychiatric: He has a normal mood and affect. His behavior is normal.  Vitals reviewed.  wound is less wide than before still has good healing granulation tissue.  Results for orders placed or performed in visit on 07/04/14  Fecal occult blood, imunochemical  Result Value Ref Range   Fecal Occult Bld Negative Negative      Assessment & Plan:   Problem List Items Addressed This Visit       Other   Puncture wound of lower leg, left - Primary    Continue Mepilex and see back in a week.          Follow up plan: Return in about 1 week (around 10/05/2014), or if symptoms worsen or fail to improve.  Caryl Pina, MD [facility] 09/28/2014, 10:58 AM

## 2014-09-28 NOTE — Assessment & Plan Note (Signed)
Continue Mepilex and see back in a week.

## 2014-10-05 ENCOUNTER — Ambulatory Visit (INDEPENDENT_AMBULATORY_CARE_PROVIDER_SITE_OTHER): Payer: Medicare Other | Admitting: Family Medicine

## 2014-10-05 ENCOUNTER — Encounter: Payer: Self-pay | Admitting: Family Medicine

## 2014-10-05 VITALS — BP 101/65 | HR 91 | Temp 98.1°F | Ht 68.0 in | Wt 196.6 lb

## 2014-10-05 DIAGNOSIS — S81832D Puncture wound without foreign body, left lower leg, subsequent encounter: Secondary | ICD-10-CM | POA: Diagnosis not present

## 2014-10-05 DIAGNOSIS — S46911A Strain of unspecified muscle, fascia and tendon at shoulder and upper arm level, right arm, initial encounter: Secondary | ICD-10-CM

## 2014-10-05 MED ORDER — TIZANIDINE HCL 2 MG PO TABS: 2.0000 mg | ORAL_TABLET | Freq: Three times a day (TID) | ORAL | Status: DC | PRN

## 2014-10-05 NOTE — Progress Notes (Signed)
BP 101/65 mmHg  Pulse 91  Temp(Src) 98.1 F (36.7 C) (Oral)  Ht 5\' 8"  (1.727 m)  Wt 196 lb 9.6 oz (89.177 kg)  BMI 29.90 kg/m2   Subjective:    Patient ID: Mario Powers, male    DOB: 10/05/1940, 74 y.o.   MRN: [id number]  HPI: Mario Powers is a 74 y.o. male presenting on 10/05/2014 for Follow up leg wound and Right shoulder pain   HPI Wound recheck  Patient presents for wound check and he has continued to be using the Mepilex and gauze pad over top. Wound is almost fully healed over with scab over most the wound.  Right shoulder pain Patient also states that is been having right shoulder pain for the past couple days. He has had this pain once previously for much of felt a lot of pain with overhead movement. This time is not as bad as it was previously but he still has pain with overhead movement. Last time he took anti-inflammatories and it went away on its own. The pain is located mostly in the anterior right arm over the biceps region. His pain is 2 out of 10  Relevant past medical, surgical, family and social history reviewed and updated as indicated. Interim medical history since our last visit reviewed. Allergies and medications reviewed and updated.  Review of Systems  Constitutional: Negative for fever.  HENT: Negative for ear discharge and ear pain.   Eyes: Negative for discharge and visual disturbance.  Respiratory: Negative for shortness of breath and wheezing.   Cardiovascular: Negative for chest pain and leg swelling.  Gastrointestinal: Negative for abdominal pain, diarrhea and constipation.  Genitourinary: Negative for difficulty urinating.  Musculoskeletal: Positive for arthralgias (right arm pain between shoulder and elbow over biceps). Negative for back pain and gait problem.  Skin: Positive for wound (left lower leg wound). Negative for rash.  Neurological: Negative for syncope, light-headedness and headaches.  All other systems reviewed and are  negative.   Per HPI unless specifically indicated above     Medication List       This list is accurate as of: 10/05/14  2:36 PM.  Always use your most recent med list.               aspirin 81 MG tablet  Take 81 mg by mouth daily.     atorvastatin 80 MG tablet  Commonly known as:  LIPITOR  Take 0.5 tablets (40 mg total) by mouth daily. As directed     Fish Oil 1200 MG Caps  Take 1,200 mg by mouth 2 (two) times daily.     losartan-hydrochlorothiazide 100-12.5 MG per tablet  Commonly known as:  HYZAAR  TAKE 1 TABLET BY MOUTH ONCE A DAY     multivitamin tablet  Take 1 tablet by mouth daily.     niacin 1000 MG CR tablet  Commonly known as:  NIASPAN  TAKE 3 TABLETS BY MOUTH AT BEDTIME     pioglitazone 30 MG tablet  Commonly known as:  ACTOS  TAKE 1 TABLET BY MOUTH EVERY DAY     tiZANidine 2 MG tablet  Commonly known as:  ZANAFLEX  Take 1 tablet (2 mg total) by mouth every 8 (eight) hours as needed for muscle spasms.     Vitamin D 2000 UNITS Caps  Take 1 capsule by mouth daily.           Objective:    BP 101/65 mmHg  Pulse 91  Temp(Src) 98.1 F (36.7 C) (Oral)  Ht 5\' 8"  (1.727 m)  Wt 196 lb 9.6 oz (89.177 kg)  BMI 29.90 kg/m2  Wt Readings from Last 3 Encounters:  10/05/14 196 lb 9.6 oz (89.177 kg)  09/28/14 198 lb 12.8 oz (90.175 kg)  09/16/14 196 lb 9.6 oz (89.177 kg)    Physical Exam  Constitutional: He is oriented to person, place, and time. He appears well-developed and well-nourished. No distress.  Eyes: Conjunctivae and EOM are normal. Pupils are equal, round, and reactive to light. Right eye exhibits no discharge. No scleral icterus.  Cardiovascular: Normal rate, regular rhythm, normal heart sounds and intact distal pulses.   No murmur heard. Pulmonary/Chest: Effort normal and breath sounds normal. No respiratory distress. He has no wheezes.  Musculoskeletal: Normal range of motion. He exhibits tenderness (worse over the long head of the biceps  and muscle body of the bicep on his right arm.). He exhibits no edema.  Range of motion intact. With abduction, supination of elbow and flexion of the shoulder. Almost no pain with passive range of motion  Neurological: He is alert and oriented to person, place, and time. Coordination normal.  Skin: Skin is warm and dry. No rash noted. He is not diaphoretic.  Wound is almost completely healed and scabbed over.  Psychiatric: He has a normal mood and affect. His behavior is normal.  Vitals reviewed.   Results for orders placed or performed in visit on 07/04/14  Fecal occult blood, imunochemical  Result Value Ref Range   Fecal Occult Bld Negative Negative   Dressing change: Remove Mepilex and saw that wound was much improved patient will continue to place petroleum jelly with a gauze pad over wound for now about 12 hours a day. No need for further follow-up for the wound.    Assessment & Plan:   Problem List Items Addressed This Visit      Other   Puncture wound of lower leg, left - Primary    Wound is mostly scabbed over at this point and looks pretty good. Instructed patient to keep it covered with petroleum jelly for about half the day at nighttime and to keep it open to the air during the daytime. Continue to keep it clean. Still no MR Tamala Julian in pools or [place] at this point.       Other Visit Diagnoses    Right shoulder strain, initial encounter        Recommended stretching exercises and anti-inflammatories. Will send a muscle relaxer as well.        Follow up plan: Return if symptoms worsen or fail to improve.  Caryl Pina, MD [facility] 10/05/2014, 2:36 PM

## 2014-10-05 NOTE — Assessment & Plan Note (Signed)
Wound is mostly scabbed over at this point and looks pretty good. Instructed patient to keep it covered with petroleum jelly for about half the day at nighttime and to keep it open to the air during the daytime. Continue to keep it clean. Still no MR Tamala Julian in pools or [place] at this point.

## 2014-10-18 ENCOUNTER — Encounter: Payer: Self-pay | Admitting: Family Medicine

## 2014-10-18 ENCOUNTER — Ambulatory Visit (INDEPENDENT_AMBULATORY_CARE_PROVIDER_SITE_OTHER): Payer: Medicare Other | Admitting: Family Medicine

## 2014-10-18 VITALS — BP 115/72 | HR 72 | Temp 97.0°F | Ht 68.0 in | Wt 197.0 lb

## 2014-10-18 DIAGNOSIS — N400 Enlarged prostate without lower urinary tract symptoms: Secondary | ICD-10-CM | POA: Diagnosis not present

## 2014-10-18 DIAGNOSIS — E559 Vitamin D deficiency, unspecified: Secondary | ICD-10-CM

## 2014-10-18 DIAGNOSIS — R972 Elevated prostate specific antigen [PSA]: Secondary | ICD-10-CM

## 2014-10-18 DIAGNOSIS — E785 Hyperlipidemia, unspecified: Secondary | ICD-10-CM

## 2014-10-18 DIAGNOSIS — I10 Essential (primary) hypertension: Secondary | ICD-10-CM | POA: Diagnosis not present

## 2014-10-18 DIAGNOSIS — E8881 Metabolic syndrome: Secondary | ICD-10-CM

## 2014-10-18 DIAGNOSIS — Z7689 Persons encountering health services in other specified circumstances: Secondary | ICD-10-CM | POA: Diagnosis not present

## 2014-10-18 NOTE — Progress Notes (Signed)
Subjective:    Patient ID: Mario Powers, male    DOB: 1940-10-15, 74 y.o.   MRN: [id number]  HPI Pt here for follow up and management of chronic medical problems which includes hypertension and hyperlipidemia. He is taking medications regularly. This patient has a history of periodic elevations in his PSA. The last one was done in February and we will make sure that we check another one today. He does complain today of some right shoulder discomfort and a wound on his left leg. This happened about 9 weeks ago. He had have sutures in the emergency room. It is healing well but slowly. He also is not sure how he injured his right shoulder may have been pulling the handle on the chainsaw or pulling the low more. It is getting some better and he is able to raise his arm above his head he would know it is sore. The patient denies chest pain shortness of breath and trouble swallowing heartburn and indigestion nausea vomiting or diarrhea. He is passing his water without problems.      Patient Active Problem List   Diagnosis Date Noted  . Puncture wound of lower leg, left 09/09/2014  . Metabolic syndrome 05/11/2013  . Elevated blood sugar 01/13/2013  . Personal history of colonic polyps 01/13/2013  . Hyperlipidemia 07/22/2012  . Hypertension 07/22/2012  . BPH (benign prostatic hypertrophy) 07/22/2012  . Vitamin D deficiency 07/22/2012   Outpatient Encounter Prescriptions as of 10/18/2014  Medication Sig  . aspirin 81 MG tablet Take 81 mg by mouth daily.  Marland Kitchen atorvastatin (LIPITOR) 80 MG tablet Take 0.5 tablets (40 mg total) by mouth daily. As directed  . Cholecalciferol (VITAMIN D) 2000 UNITS CAPS Take 1 capsule by mouth daily.  Marland Kitchen losartan-hydrochlorothiazide (HYZAAR) 100-12.5 MG per tablet TAKE 1 TABLET BY MOUTH ONCE A DAY  . Multiple Vitamin (MULTIVITAMIN) tablet Take 1 tablet by mouth daily.  . niacin (NIASPAN) 1000 MG CR tablet TAKE 3 TABLETS BY MOUTH AT BEDTIME  . Omega-3 Fatty Acids  (FISH OIL) 1200 MG CAPS Take 1,200 mg by mouth 2 (two) times daily.  . pioglitazone (ACTOS) 30 MG tablet TAKE 1 TABLET BY MOUTH EVERY DAY  . tiZANidine (ZANAFLEX) 2 MG tablet Take 1 tablet (2 mg total) by mouth every 8 (eight) hours as needed for muscle spasms. (Patient not taking: Reported on 10/18/2014)   No facility-administered encounter medications on file as of 10/18/2014.      Review of Systems  Constitutional: Negative.   HENT: Negative.   Eyes: Negative.   Respiratory: Negative.   Cardiovascular: Negative.   Gastrointestinal: Negative.   Endocrine: Negative.   Genitourinary: Negative.   Musculoskeletal: Positive for arthralgias (right shoulder).  Skin: Positive for wound (rck would left lower leg).  Allergic/Immunologic: Negative.   Neurological: Negative.   Hematological: Negative.   Psychiatric/Behavioral: Negative.        Objective:   Physical Exam  Constitutional: He is oriented to person, place, and time. He appears well-developed and well-nourished. No distress.  Pleasant active and alert  HENT:  Head: Normocephalic and atraumatic.  Right Ear: External ear normal.  Left Ear: External ear normal.  Mouth/Throat: Oropharynx is clear and moist. No oropharyngeal exudate.  Nasal congestion bilaterally  Eyes: Conjunctivae and EOM are normal. Pupils are equal, round, and reactive to light. Right eye exhibits no discharge. Left eye exhibits no discharge. No scleral icterus.  Neck: Normal range of motion. Neck supple. No thyromegaly present.  No bruits thyromegaly  or anterior cervical adenopathy  Cardiovascular: Normal rate, regular rhythm, normal heart sounds and intact distal pulses.  Exam reveals no gallop and no friction rub.   No murmur heard. At 72/m  Pulmonary/Chest: Effort normal and breath sounds normal. No respiratory distress. He has no wheezes. He has no rales. He exhibits no tenderness.  No axillary adenopathy and no chest wall masses.  Abdominal: Soft.  Bowel sounds are normal. He exhibits no mass. There is no tenderness. There is no rebound and no guarding.  No inguinal adenopathy. Patient does have a small umbilical hernia.  Musculoskeletal: He exhibits tenderness. He exhibits no edema.  The patient does have some limited range of motion of raising the right arm is slightly tender at the deltoid insertion. He is able to raise his arm and put it behind his head and he says his movement with this is improving.  Lymphadenopathy:    He has no cervical adenopathy.  Neurological: He is alert and oriented to person, place, and time. He has normal reflexes. No cranial nerve deficit.  Skin: Skin is warm and dry. No rash noted.  Psychiatric: He has a normal mood and affect. His behavior is normal. Judgment and thought content normal.  Nursing note and vitals reviewed.     BP 115/72 mmHg  Pulse 72  Temp(Src) 97 F (36.1 C) (Oral)  Ht $R'5\' 8"'JO$  (1.727 m)  Wt 197 lb (89.359 kg)  BMI 29.96 kg/m2      Assessment & Plan:  1. BPH (benign prostatic hypertrophy) -The patient is passing his water without problems and a rectal exam was not done today but we will do a PSA because of his periodic elevations of this test. - CBC with Differential/Platelet - PSA  2. Essential hypertension -The blood pressure is good today and there will be no change in treatment - BMP8+EGFR - CBC with Differential/Platelet - Hepatic function panel  3. Hyperlipidemia -He should continue his current treatment pending results of lab work - CBC with Differential/Platelet - NMR, lipoprofile  4. Vitamin D deficiency -Continue current treatment pending results of lab work - CBC with Differential/Platelet - Vit D  25 hydroxy (rtn osteoporosis monitoring)  5. Metabolic syndrome -Continue to walk and exercise regularly and work on losing weight to get his BMI down to 25. - BMP8+EGFR - CBC with Differential/Platelet  6. Elevated PSA -The most recent PSA in February was  down into the 3 range. We will recheck this again today. - CBC with Differential/Platelet - PSA  Patient Instructions                       Medicare Annual Wellness Visit  [facility] and the medical providers at [facility] strive to bring you the best medical care.  In doing so we not only want to address your current medical conditions and concerns but also to detect new conditions early and prevent illness, disease and health-related problems.    Medicare offers a yearly Wellness Visit which allows our clinical staff to assess your need for preventative services including immunizations, lifestyle education, counseling to decrease risk of preventable diseases and screening for fall risk and other medical concerns.    This visit is provided free of charge (no copay) for all Medicare recipients. The clinical pharmacists at [facility] have begun to conduct these Wellness Visits which will also include a thorough review of all your medications.    As you primary medical  provider recommend that you make an appointment for your Annual Wellness Visit if you have not done so already this year.  You may set up this appointment before you leave today or you may call back ([phone]) and schedule an appointment.  Please make sure when you call that you mention that you are scheduling your Annual Wellness Visit with the clinical pharmacist so that the appointment may be made for the proper length of time.     Continue current medications. Continue good therapeutic lifestyle changes which include good diet and exercise. Fall precautions discussed with patient. If an FOBT was given today- please return it to our front desk. If you are over 75 years old - you may need Prevnar 70 or the adult Pneumonia vaccine.  **Flu shots will be available soon--- please call and schedule a FLU-CLINIC appointment**  After your visit with [place] today you will receive a survey in  the mail or online from [employer] regarding your care with [place]. Please take a moment to fill this out. Your feedback is very important to [place] as you can help [place] better understand your patient needs as well as improve your experience and satisfaction. WE CARE ABOUT YOU!!!   **Please join [place] SEPT.[email or web address], 2016 from 5:00 to 7:00pm for our OPEN HOUSE! Come out and meet our NEW providers** The patient should make a special effort to be more careful with lifting pushing and pulling and also when he is working in the yard he should wear long pants to protect his skin from being cut her lacerated. He should drink plenty of water and fluids He should continue to keep the leg wound moisturized with lotions and creams. He should practice some exercises for his right shoulder circular exercises and climbing the wall exercises in addition to using warm wet compresses 20 minutes 3 or 4 times daily. If the shoulder is not continue to improve or would recommend he come back and get x-rays of the shoulder. We will call him with the lab work once it becomes available today.   Arrie Senate MD

## 2014-10-18 NOTE — Patient Instructions (Addendum)
Medicare Annual Wellness Visit  [facility] and the medical providers at [facility] strive to bring you the best medical care.  In doing so we not only want to address your current medical conditions and concerns but also to detect new conditions early and prevent illness, disease and health-related problems.    Medicare offers a yearly Wellness Visit which allows our clinical staff to assess your need for preventative services including immunizations, lifestyle education, counseling to decrease risk of preventable diseases and screening for fall risk and other medical concerns.    This visit is provided free of charge (no copay) for all Medicare recipients. The clinical pharmacists at [facility] have begun to conduct these Wellness Visits which will also include a thorough review of all your medications.    As you primary medical [facility] recommend that you make an appointment for your Annual Wellness Visit if you have not done so already this year.  You may set up this appointment before you leave today or you may call back ([phone]) and schedule an appointment.  Please make sure when you call that you mention that you are scheduling your Annual Wellness Visit with the clinical pharmacist so that the appointment may be made for the proper length of time.     Continue current medications. Continue good therapeutic lifestyle changes which include good diet and exercise. Fall precautions discussed with patient. If an FOBT was given today- please return it to our front desk. If you are over 52 years old - you may need Prevnar 17 or the adult Pneumonia vaccine.  **Flu shots will be available soon--- please call and schedule a FLU-CLINIC appointment**  After your visit with [place] today you will receive a survey in the mail or online from [employer] regarding your care with [place]. Please take a moment to fill this out. Your feedback is  very important to [place] as you can help [place] better understand your patient needs as well as improve your experience and satisfaction. WE CARE ABOUT YOU!!!   **Please join [place] SEPT.22, 2016 from 5:00 to 7:00pm for our OPEN HOUSE! Come out and meet our NEW providers** The patient should make a special effort to be more careful with lifting pushing and pulling and also when he is working in the yard he should wear long pants to protect his skin from being cut her lacerated. He should drink plenty of water and fluids He should continue to keep the leg wound moisturized with lotions and creams. He should practice some exercises for his right shoulder circular exercises and climbing the wall exercises in addition to using warm wet compresses 20 minutes 3 or 4 times daily. If the shoulder is not continue to improve or would recommend he come back and get x-rays of the shoulder. We will call him with the lab work once it becomes available today.

## 2014-10-19 LAB — BMP8+EGFR
BUN / CREAT RATIO: 17 (ref 10–22)
BUN: 17 mg/dL (ref 8–27)
CALCIUM: 8.9 mg/dL (ref 8.6–10.2)
CHLORIDE: 99 mmol/L (ref 97–108)
CO2: 25 mmol/L (ref 18–29)
Creatinine, Ser: 1.03 mg/dL (ref 0.76–1.27)
GFR calc Af Amer: 83 mL/min/{1.73_m2} (ref >59)
GFR calc non Af Amer: 72 mL/min/{1.73_m2} (ref >59)
GLUCOSE: 104 mg/dL — AB (ref 65–99)
Potassium: 4.4 mmol/L (ref 3.5–5.2)
Sodium: 138 mmol/L (ref 134–144)

## 2014-10-19 LAB — CBC WITH DIFFERENTIAL/PLATELET
BASOS ABS: 0 10*3/uL (ref 0.0–0.2)
Basos: 1 %
EOS (ABSOLUTE): 0.2 10*3/uL (ref 0.0–0.4)
Eos: 3 %
HEMOGLOBIN: 14.5 g/dL (ref 12.6–17.7)
Hematocrit: 43.8 % (ref 37.5–51.0)
IMMATURE GRANULOCYTES: 0 %
Immature Grans (Abs): 0 10*3/uL (ref 0.0–0.1)
LYMPHS ABS: 2.2 10*3/uL (ref 0.7–3.1)
Lymphs: 36 %
MCH: 33.6 pg — ABNORMAL HIGH (ref 26.6–33.0)
MCHC: 33.1 g/dL (ref 31.5–35.7)
MCV: 101 fL — ABNORMAL HIGH (ref 79–97)
MONOCYTES: 10 %
Monocytes Absolute: 0.6 10*3/uL (ref 0.1–0.9)
NEUTROS PCT: 50 %
Neutrophils Absolute: 3 10*3/uL (ref 1.4–7.0)
Platelets: 272 10*3/uL (ref 150–379)
RBC: 4.32 x10E6/uL (ref 4.14–5.80)
RDW: 13.5 % (ref 12.3–15.4)
WBC: 6 10*3/uL (ref 3.4–10.8)

## 2014-10-19 LAB — HEPATIC FUNCTION PANEL
ALBUMIN: 3.8 g/dL (ref 3.5–4.8)
ALT: 21 IU/L (ref 0–44)
AST: 28 IU/L (ref 0–40)
Alkaline Phosphatase: 87 IU/L (ref 39–117)
Bilirubin Total: 1.2 mg/dL (ref 0.0–1.2)
Bilirubin, Direct: 0.33 mg/dL (ref 0.00–0.40)
TOTAL PROTEIN: 6.4 g/dL (ref 6.0–8.5)

## 2014-10-19 LAB — VITAMIN D 25 HYDROXY (VIT D DEFICIENCY, FRACTURES): Vit D, 25-Hydroxy: 48.9 ng/mL (ref 30.0–100.0)

## 2014-10-19 LAB — NMR, LIPOPROFILE
CHOLESTEROL: 123 mg/dL (ref 100–199)
HDL CHOLESTEROL BY NMR: 57 mg/dL (ref >39)
HDL PARTICLE NUMBER: 32.4 umol/L (ref >=30.5)
LDL Particle Number: 771 nmol/L (ref <1000)
LDL Size: 20.2 nm (ref >20.5)
LDL-C: 57 mg/dL (ref 0–99)
LP-IR Score: 37 (ref <=45)
Small LDL Particle Number: 499 nmol/L (ref <=527)
Triglycerides by NMR: 46 mg/dL (ref 0–149)

## 2014-10-19 LAB — PSA: Prostate Specific Ag, Serum: 3.9 ng/mL (ref 0.0–4.0)

## 2014-10-20 LAB — VITAMIN B12: VITAMIN B 12: 734 pg/mL (ref 211–946)

## 2014-10-20 LAB — SPECIMEN STATUS REPORT

## 2014-11-22 DIAGNOSIS — L570 Actinic keratosis: Secondary | ICD-10-CM | POA: Diagnosis not present

## 2014-12-05 ENCOUNTER — Encounter: Payer: Self-pay | Admitting: Family Medicine

## 2014-12-07 NOTE — Telephone Encounter (Signed)
Pt to be made aware of recommendations of clinical pharm

## 2014-12-21 ENCOUNTER — Other Ambulatory Visit: Payer: Self-pay | Admitting: Family Medicine

## 2015-01-09 ENCOUNTER — Other Ambulatory Visit: Payer: Self-pay | Admitting: Family Medicine

## 2015-02-08 ENCOUNTER — Encounter: Payer: Self-pay | Admitting: Family Medicine

## 2015-02-08 ENCOUNTER — Ambulatory Visit (INDEPENDENT_AMBULATORY_CARE_PROVIDER_SITE_OTHER): Payer: Medicare Other | Admitting: Family Medicine

## 2015-02-08 VITALS — BP 117/65 | HR 73 | Temp 97.6°F | Ht 68.0 in | Wt 201.0 lb

## 2015-02-08 DIAGNOSIS — E8881 Metabolic syndrome: Secondary | ICD-10-CM

## 2015-02-08 DIAGNOSIS — E785 Hyperlipidemia, unspecified: Secondary | ICD-10-CM | POA: Diagnosis not present

## 2015-02-08 DIAGNOSIS — E559 Vitamin D deficiency, unspecified: Secondary | ICD-10-CM | POA: Diagnosis not present

## 2015-02-08 DIAGNOSIS — I10 Essential (primary) hypertension: Secondary | ICD-10-CM | POA: Diagnosis not present

## 2015-02-08 DIAGNOSIS — N400 Enlarged prostate without lower urinary tract symptoms: Secondary | ICD-10-CM

## 2015-02-08 NOTE — Patient Instructions (Addendum)
Medicare Annual Wellness Visit  [facility] and the medical providers at [facility] strive to bring you the best medical care.  In doing so we not only want to address your current medical conditions and concerns but also to detect new conditions early and prevent illness, disease and health-related problems.    Medicare offers a yearly Wellness Visit which allows our clinical staff to assess your need for preventative services including immunizations, lifestyle education, counseling to decrease risk of preventable diseases and screening for fall risk and other medical concerns.    This visit is provided free of charge (no copay) for all Medicare recipients. The clinical pharmacists at [facility] have begun to conduct these Wellness Visits which will also include a thorough review of all your medications.    As you primary medical provider recommend that you make an appointment for your Annual Wellness Visit if you have not done so already this year.  You may set up this appointment before you leave today or you may call back [phone] and schedule an appointment.  Please make sure when you call that you mention that you are scheduling your Annual Wellness Visit with the clinical pharmacist so that the appointment may be made for the proper length of time.     Continue current medications. Continue good therapeutic lifestyle changes which include good diet and exercise. Fall precautions discussed with patient. If an FOBT was given today- please return it to our front desk. If you are over 48 years old - you may need Prevnar 90 or the adult Pneumonia vaccine.  **Flu shots are available--- please call and schedule a FLU-CLINIC appointment**  After your visit with [place] today you will receive a survey in the mail or online from [employer] regarding your care with [place]. Please take a moment to fill this out. Your feedback is very  important to [place] as you can help [place] better understand your patient needs as well as improve your experience and satisfaction. WE CARE ABOUT YOU!!!   Use nasal saline 3 or 4 times daily each nostril Find a sound diversion to use at nighttime to diminish the heartbeat sounds in your ears Continue to work on cholesterol and diet and weight loss

## 2015-02-08 NOTE — Progress Notes (Signed)
Subjective:    Patient ID: Mario Powers, male    DOB: [birth year], 75 y.o.   MRN: [id number]  HPI Pt here for follow up and management of chronic medical problems which includes hyperlipidemia. He is taking medications regularly. The patient's biggest complaint today is that he can hear his heart beating in his ears. This is been going on for some time. He will get lab work today. He has concerns about his blood sugar being elevated in the past. Looking back he has had some blood sugars as high as 104 fasting and I will reassure him about that and of course we will be checking blood sugars today with his lab work. The patient denies chest pain shortness of breath trouble swallowing and heartburn indigestion and nausea vomiting diarrhea or blood in the stool. He is somewhat active in his activity.   Patient Active Problem List   Diagnosis Date Noted  . Puncture wound of lower leg, left 09/09/2014  . Metabolic syndrome 29/51/8841  . Elevated blood sugar 01/13/2013  . Personal history of colonic polyps 01/13/2013  . Hyperlipidemia 07/22/2012  . Hypertension 07/22/2012  . BPH (benign prostatic hypertrophy) 07/22/2012  . Vitamin D deficiency 07/22/2012   Outpatient Encounter Prescriptions as of 02/08/2015  Medication Sig  . aspirin 81 MG tablet Take 81 mg by mouth daily.  Marland Kitchen atorvastatin (LIPITOR) 80 MG tablet TAKE 1 TABLET BY MOUTH DAILY.  Marland Kitchen Cholecalciferol (VITAMIN D) 2000 UNITS CAPS Take 1 capsule by mouth daily.  . fluorouracil (EFUDEX) 5 % cream Apply topically 2 (two) times daily.  Marland Kitchen losartan-hydrochlorothiazide (HYZAAR) 100-12.5 MG per tablet TAKE 1 TABLET BY MOUTH ONCE A DAY  . Multiple Vitamin (MULTIVITAMIN) tablet Take 1 tablet by mouth daily.  . niacin (NIASPAN) 1000 MG CR tablet TAKE 3 TABLETS BY MOUTH AT BEDTIME  . Omega-3 Fatty Acids (FISH OIL) 1200 MG CAPS Take 1,200 mg by mouth 2 (two) times daily.  . pioglitazone (ACTOS) 30 MG tablet TAKE 1 TABLET BY MOUTH EVERY DAY  .  tiZANidine (ZANAFLEX) 2 MG tablet Take 1 tablet (2 mg total) by mouth every 8 (eight) hours as needed for muscle spasms.  . [DISCONTINUED] atorvastatin (LIPITOR) 80 MG tablet Take 0.5 tablets (40 mg total) by mouth daily. As directed   No facility-administered encounter medications on file as of 02/08/2015.      Review of Systems  Constitutional: Negative.   HENT: Negative.        Ears ringing and can hear "heartbeat"  Eyes: Negative.   Respiratory: Negative.   Cardiovascular: Negative.   Gastrointestinal: Negative.   Endocrine: Negative.   Genitourinary: Negative.   Musculoskeletal: Negative.   Skin: Negative.   Allergic/Immunologic: Negative.   Neurological: Negative.   Hematological: Negative.   Psychiatric/Behavioral: Negative.        Objective:   Physical Exam  Constitutional: He is oriented to person, place, and time. He appears well-developed and well-nourished. No distress.  Alert and cooperative  HENT:  Head: Normocephalic and atraumatic.  Right Ear: External ear normal.  Left Ear: External ear normal.  Mouth/Throat: Oropharynx is clear and moist. No oropharyngeal exudate.  Slight nasal redness and congestion  Eyes: Conjunctivae and EOM are normal. Pupils are equal, round, and reactive to light. Right eye exhibits no discharge. Left eye exhibits no discharge. No scleral icterus.  Neck: Normal range of motion. Neck supple. No thyromegaly present.  No bruits thyromegaly or adenopathy  Cardiovascular: Normal rate, regular rhythm, normal heart sounds and  intact distal pulses.   No murmur heard. The heart is regular at 60/m without murmur  Pulmonary/Chest: Effort normal and breath sounds normal. No respiratory distress. He has no wheezes. He has no rales. He exhibits no tenderness.  Clear anteriorly and posteriorly. No axillary adenopathy or chest wall masses.  Abdominal: Soft. Bowel sounds are normal. He exhibits no mass. There is no tenderness. There is no rebound and  no guarding.  The abdomen is slightly obese and the patient was reminded that his BMI needs to be closer to 25 and below 30. He was instructed on how to do this with more fluid intake and more exercise.  Musculoskeletal: Normal range of motion. He exhibits no edema or tenderness.  Lymphadenopathy:    He has no cervical adenopathy.  Neurological: He is alert and oriented to person, place, and time. He has normal reflexes. No cranial nerve deficit.  Skin: Skin is warm and dry. No rash noted.  He is currently receiving a treatment from the dermatologist for his arms and applying some cream to the arms and they were somewhat red and inflamed and was told that this was normal with a treatment.  Psychiatric: He has a normal mood and affect. His behavior is normal. Judgment and thought content normal.  Nursing note and vitals reviewed.    BP 117/65 mmHg  Pulse 73  Temp(Src) 97.6 F (36.4 C) (Oral)  Ht _0  (1.727 m)  Wt 201 lb (91.173 kg)  BMI 30.57 kg/m2      Assessment & Plan:  1. Essential hypertension -Blood pressure is good today the patient will continue with current treatment - BMP8+EGFR - CBC with Differential/Platelet - Hepatic function panel  2. Hyperlipidemia -Continue current treatment pending results of lab work - CBC with Differential/Platelet - NMR, lipoprofile  3. Vitamin D deficiency -Continue current treatment pending results of lab work - CBC with Differential/Platelet - VITAMIN D 25 Hydroxy (Vit-D Deficiency, Fractures)  4. Metabolic syndrome -Patient was encouraged to exercise more and watch his diet was slowly and try to lose more weight and get his body mass index lower - CBC with Differential/Platelet  5. BPH (benign prostatic hypertrophy) -No symptoms with this today. - CBC with Differential/Platelet  Patient Instructions                       Medicare Annual Wellness Visit  [facility] and the medical providers at [facility] strive to bring you the best medical care.  In doing so we not only want to address your current medical conditions and concerns but also to detect new conditions early and prevent illness, disease and health-related problems.    Medicare offers a yearly Wellness Visit which allows our clinical staff to assess your need for preventative services including immunizations, lifestyle education, counseling to decrease risk of preventable diseases and screening for fall risk and other medical concerns.    This visit is provided free of charge (no copay) for all Medicare recipients. The clinical pharmacists at [facility] have begun to conduct these Wellness Visits which will also include a thorough review of all your medications.    As you primary medical provider recommend that you make an appointment for your Annual Wellness Visit if you have not done so already this year.  You may set up this appointment before you leave today or you may call back [phone]) and schedule an appointment.  Please make sure when you  call that you mention that you are scheduling your Annual Wellness Visit with the clinical pharmacist so that the appointment may be made for the proper length of time.     Continue current medications. Continue good therapeutic lifestyle changes which include good diet and exercise. Fall precautions discussed with patient. If an FOBT was given today- please return it to our front desk. If you are over 24 years old - you may need Prevnar 14 or the adult Pneumonia vaccine.  **Flu shots are available--- please call and schedule a FLU-CLINIC appointment**  After your visit with [place] today you will receive a survey in the mail or online from [employer] regarding your care with [place]. Please take a moment to fill this out. Your feedback is very important to [place] as you can help [place] better understand your patient needs as well as improve your experience and satisfaction. WE  CARE ABOUT YOU!!!   Use nasal saline 3 or 4 times daily each nostril Find a sound diversion to use at nighttime to diminish the heartbeat sounds in your ears Continue to work on cholesterol and diet and weight loss   Arrie Senate MD

## 2015-02-09 LAB — HEPATIC FUNCTION PANEL
ALT: 23 IU/L (ref 0–44)
AST: 26 IU/L (ref 0–40)
Albumin: 3.9 g/dL (ref 3.5–4.8)
Alkaline Phosphatase: 90 IU/L (ref 39–117)
BILIRUBIN TOTAL: 0.8 mg/dL (ref 0.0–1.2)
BILIRUBIN, DIRECT: 0.23 mg/dL (ref 0.00–0.40)
Total Protein: 6.8 g/dL (ref 6.0–8.5)

## 2015-02-09 LAB — CBC WITH DIFFERENTIAL/PLATELET
BASOS: 0 %
Basophils Absolute: 0 10*3/uL (ref 0.0–0.2)
EOS (ABSOLUTE): 0.2 10*3/uL (ref 0.0–0.4)
EOS: 3 %
HEMATOCRIT: 45.3 % (ref 37.5–51.0)
Hemoglobin: 15.2 g/dL (ref 12.6–17.7)
IMMATURE GRANS (ABS): 0 10*3/uL (ref 0.0–0.1)
IMMATURE GRANULOCYTES: 0 %
LYMPHS: 27 %
Lymphocytes Absolute: 2.1 10*3/uL (ref 0.7–3.1)
MCH: 33.9 pg — ABNORMAL HIGH (ref 26.6–33.0)
MCHC: 33.6 g/dL (ref 31.5–35.7)
MCV: 101 fL — AB (ref 79–97)
Monocytes Absolute: 0.8 10*3/uL (ref 0.1–0.9)
Monocytes: 10 %
NEUTROS PCT: 60 %
Neutrophils Absolute: 4.6 10*3/uL (ref 1.4–7.0)
Platelets: 298 10*3/uL (ref 150–379)
RBC: 4.48 x10E6/uL (ref 4.14–5.80)
RDW: 12.9 % (ref 12.3–15.4)
WBC: 7.8 10*3/uL (ref 3.4–10.8)

## 2015-02-09 LAB — NMR, LIPOPROFILE
Cholesterol: 135 mg/dL (ref 100–199)
HDL Cholesterol by NMR: 49 mg/dL (ref >39)
HDL Particle Number: 31.3 umol/L (ref >=30.5)
LDL Particle Number: 803 nmol/L (ref <1000)
LDL SIZE: 21.3 nm (ref >20.5)
LDL-C: 68 mg/dL (ref 0–99)
LP-IR Score: 35 (ref <=45)
SMALL LDL PARTICLE NUMBER: 405 nmol/L (ref <=527)
Triglycerides by NMR: 92 mg/dL (ref 0–149)

## 2015-02-09 LAB — BMP8+EGFR
BUN/Creatinine Ratio: 14 (ref 10–22)
BUN: 16 mg/dL (ref 8–27)
CO2: 25 mmol/L (ref 18–29)
CREATININE: 1.11 mg/dL (ref 0.76–1.27)
Calcium: 9.3 mg/dL (ref 8.6–10.2)
Chloride: 98 mmol/L (ref 96–106)
GFR calc Af Amer: 76 mL/min/{1.73_m2} (ref >59)
GFR, EST NON AFRICAN AMERICAN: 66 mL/min/{1.73_m2} (ref >59)
Glucose: 94 mg/dL (ref 65–99)
Potassium: 4.6 mmol/L (ref 3.5–5.2)
SODIUM: 137 mmol/L (ref 134–144)

## 2015-02-09 LAB — VITAMIN D 25 HYDROXY (VIT D DEFICIENCY, FRACTURES): Vit D, 25-Hydroxy: 46.2 ng/mL (ref 30.0–100.0)

## 2015-04-09 ENCOUNTER — Other Ambulatory Visit: Payer: Self-pay | Admitting: Family Medicine

## 2015-04-10 ENCOUNTER — Other Ambulatory Visit: Payer: Self-pay | Admitting: Dermatology

## 2015-04-10 DIAGNOSIS — D0471 Carcinoma in situ of skin of right lower limb, including hip: Secondary | ICD-10-CM | POA: Diagnosis not present

## 2015-04-10 DIAGNOSIS — L570 Actinic keratosis: Secondary | ICD-10-CM | POA: Diagnosis not present

## 2015-04-10 DIAGNOSIS — C44712 Basal cell carcinoma of skin of right lower limb, including hip: Secondary | ICD-10-CM | POA: Diagnosis not present

## 2015-04-10 DIAGNOSIS — C44722 Squamous cell carcinoma of skin of right lower limb, including hip: Secondary | ICD-10-CM | POA: Diagnosis not present

## 2015-04-10 DIAGNOSIS — C44319 Basal cell carcinoma of skin of other parts of face: Secondary | ICD-10-CM | POA: Diagnosis not present

## 2015-05-17 ENCOUNTER — Encounter: Payer: Self-pay | Admitting: Gastroenterology

## 2015-05-18 DIAGNOSIS — C44311 Basal cell carcinoma of skin of nose: Secondary | ICD-10-CM | POA: Diagnosis not present

## 2015-05-18 DIAGNOSIS — C44712 Basal cell carcinoma of skin of right lower limb, including hip: Secondary | ICD-10-CM | POA: Diagnosis not present

## 2015-05-18 DIAGNOSIS — D0471 Carcinoma in situ of skin of right lower limb, including hip: Secondary | ICD-10-CM | POA: Diagnosis not present

## 2015-06-03 ENCOUNTER — Other Ambulatory Visit: Payer: Self-pay | Admitting: Family Medicine

## 2015-06-20 ENCOUNTER — Ambulatory Visit (INDEPENDENT_AMBULATORY_CARE_PROVIDER_SITE_OTHER): Payer: Medicare Other | Admitting: Family Medicine

## 2015-06-20 ENCOUNTER — Encounter: Payer: Self-pay | Admitting: Family Medicine

## 2015-06-20 VITALS — BP 120/62 | HR 67 | Temp 97.0°F | Ht 68.0 in | Wt 196.0 lb

## 2015-06-20 DIAGNOSIS — E8881 Metabolic syndrome: Secondary | ICD-10-CM

## 2015-06-20 DIAGNOSIS — N400 Enlarged prostate without lower urinary tract symptoms: Secondary | ICD-10-CM | POA: Diagnosis not present

## 2015-06-20 DIAGNOSIS — Z1211 Encounter for screening for malignant neoplasm of colon: Secondary | ICD-10-CM | POA: Diagnosis not present

## 2015-06-20 DIAGNOSIS — E559 Vitamin D deficiency, unspecified: Secondary | ICD-10-CM

## 2015-06-20 DIAGNOSIS — E785 Hyperlipidemia, unspecified: Secondary | ICD-10-CM

## 2015-06-20 DIAGNOSIS — I10 Essential (primary) hypertension: Secondary | ICD-10-CM | POA: Diagnosis not present

## 2015-06-20 NOTE — Progress Notes (Signed)
Subjective:    Patient ID: Mario Powers, male    DOB: Sep 06, 1940, 75 y.o.   MRN: [id number]  HPI Pt here for follow up and management of chronic medical problems which includes hyperlipidemia and hypertension. He is taking medications regularly.The patient has no complaints does not require any refills and is doing well. His weight today is 196 and is down 5 pounds from the previous visit. His BMI is 30.6. He is due to get lab work today and will return an FOBT. The patient has had problems with his PSA in the past and we will wait until September to check another PSA. The patient denies any chest pain shortness of breath trouble swallowing heartburn indigestion nausea vomiting diarrhea or blood in the stool. He is passing his water without problems. He does have a corn on the bottom of the left foot which gives him some problems at times but prefers to wait to have any treatment done on it at this time. [place] but will come back to the area periodically to visit grandchildren etc. The patient stopped taking his Niaspan about a month ago due to the cost of the medication.     Patient Active Problem List   Diagnosis Date Noted  . Puncture wound of lower leg, left 09/09/2014  . Metabolic syndrome 71/06/2692  . Elevated blood sugar 01/13/2013  . Personal history of colonic polyps 01/13/2013  . Hyperlipidemia 07/22/2012  . Hypertension 07/22/2012  . BPH (benign prostatic hypertrophy) 07/22/2012  . Vitamin D deficiency 07/22/2012   Outpatient Encounter Prescriptions as of 06/20/2015  Medication Sig  . aspirin 81 MG tablet Take 81 mg by mouth daily.  Marland Kitchen atorvastatin (LIPITOR) 80 MG tablet TAKE 1 TABLET BY MOUTH DAILY.  Marland Kitchen Cholecalciferol (VITAMIN D) 2000 UNITS CAPS Take 1 capsule by mouth daily.  . fluorouracil (EFUDEX) 5 % cream Apply topically 2 (two) times daily.  Marland Kitchen losartan-hydrochlorothiazide (HYZAAR) 100-12.5 MG per tablet TAKE 1 TABLET BY  MOUTH ONCE A DAY  . Multiple Vitamin (MULTIVITAMIN) tablet Take 1 tablet by mouth daily.  . Omega-3 Fatty Acids (FISH OIL) 1200 MG CAPS Take 1,200 mg by mouth 2 (two) times daily.  . pioglitazone (ACTOS) 30 MG tablet TAKE 1 TABLET BY MOUTH EVERY DAY  . [DISCONTINUED] niacin (NIASPAN) 1000 MG CR tablet TAKE 3 TABLETS BY MOUTH AT BEDTIME  . [DISCONTINUED] tiZANidine (ZANAFLEX) 2 MG tablet Take 1 tablet (2 mg total) by mouth every 8 (eight) hours as needed for muscle spasms.   No facility-administered encounter medications on file as of 06/20/2015.      Review of Systems  Constitutional: Negative.   HENT: Negative.   Eyes: Negative.   Respiratory: Negative.   Cardiovascular: Negative.   Gastrointestinal: Negative.   Endocrine: Negative.   Genitourinary: Negative.   Musculoskeletal: Negative.   Skin: Negative.   Allergic/Immunologic: Negative.   Neurological: Negative.   Hematological: Negative.   Psychiatric/Behavioral: Negative.        Objective:   Physical Exam  Constitutional: He is oriented to person, place, and time. He appears well-developed and well-nourished. No distress.  HENT:  Head: Normocephalic and atraumatic.  Right Ear: External ear normal.  Left Ear: External ear normal.  Nose: Nose normal.  Mouth/Throat: Oropharynx is clear and moist. No oropharyngeal exudate.  Eyes: Conjunctivae and EOM are normal. Pupils are equal, round, and reactive to light. Right eye exhibits no discharge. Left eye exhibits no  discharge. No scleral icterus.  Neck: Normal range of motion. Neck supple. No thyromegaly present.  No bruits or thyromegaly  Cardiovascular: Normal rate, regular rhythm, normal heart sounds and intact distal pulses.   No murmur heard. At 72/m with a regular rate and rhythm  Pulmonary/Chest: Effort normal and breath sounds normal. No respiratory distress. He has no wheezes. He has no rales. He exhibits no tenderness.  Axillary is negative for adenopathy    Abdominal: Soft. Bowel sounds are normal. He exhibits no mass. There is no tenderness. There is no rebound and no guarding.  No abdominal masses tenderness or bruits  Genitourinary: Prostate normal.  Musculoskeletal: Normal range of motion. He exhibits no edema or tenderness.  Lymphadenopathy:    He has no cervical adenopathy.  Neurological: He is alert and oriented to person, place, and time. He has normal reflexes. No cranial nerve deficit.  Skin: Skin is warm and dry. No rash noted. No erythema. No pallor.  There is a corn on the plantar surface of the foot proximal to the fifth toe.  Psychiatric: He has a normal mood and affect. His behavior is normal. Judgment and thought content normal.  Nursing note and vitals reviewed.   BP 120/62 mmHg  Pulse 67  Temp(Src) 97 F (36.1 C) (Oral)  Ht 5\' 8"  (1.727 m)  Wt 196 lb (88.905 kg)  BMI 29.81 kg/m2       Assessment & Plan:  1. Essential hypertension -The blood pressure is good today and he will continue with his current treatment and watch his sodium intake - BMP8+EGFR - CBC with Differential/Platelet - Hepatic function panel  2. Hyperlipidemia -Continue with atorvastatin pending results of lab work - CBC with Differential/Platelet - NMR, lipoprofile  3. Vitamin D deficiency -Continue with vitamin D treatment pending results of lab work - CBC with Differential/Platelet - VITAMIN D 25 Hydroxy (Vit-D Deficiency, Fractures)  4. Metabolic syndrome -Continue with aggressive therapeutic lifestyle changes with weight loss through diet and exercise - CBC with Differential/Platelet  5. BPH (benign prostatic hypertrophy) -No symptoms with passing his water - CBC with Differential/Platelet  6. Special screening for malignant neoplasms, colon -Return the FOBT - Fecal occult blood, imunochemical; Future   Patient Instructions                       Medicare Annual Wellness Visit  [place] and the medical providers at  [facility] strive to bring you the best medical care.  In doing so we not only want to address your current medical conditions and concerns but also to detect new conditions early and prevent illness, disease and health-related problems.    Medicare offers a yearly Wellness Visit which allows our clinical staff to assess your need for preventative services including immunizations, lifestyle education, counseling to decrease risk of preventable diseases and screening for fall risk and other medical concerns.    This visit is provided free of charge (no copay) for all Medicare recipients. The clinical pharmacists at [facility] have begun to conduct these Wellness Visits which will also include a thorough review of all your medications.    As you primary medical provider recommend that you make an appointment for your Annual Wellness Visit if you have not done so already this year.  You may set up this appointment before you leave today or you may call back [facility]) and schedule an appointment.  Please make sure when you call that  you mention that you are scheduling your Annual Wellness Visit with the clinical pharmacist so that the appointment may be made for the proper length of time.     Continue current medications. Continue good therapeutic lifestyle changes which include good diet and exercise. Fall precautions discussed with patient. If an FOBT was given today- please return it to our front desk. If you are over 77 years old - you may need Prevnar 52 or the adult Pneumonia vaccine.  **Flu shots are available--- please call and schedule a FLU-CLINIC appointment**  After your visit with [place] today you will receive a survey in the mail or online from [employer] regarding your care with [place]. Please take a moment to fill this out. Your feedback is very important to [place] as you can help [place] better understand your patient needs as well as improve your  experience and satisfaction. WE CARE ABOUT YOU!!!      Arrie Senate MD

## 2015-06-20 NOTE — Patient Instructions (Addendum)
Medicare Annual Wellness Visit  [place] and the medical providers at [facility] strive to bring you the best medical care.  In doing so we not only want to address your current medical conditions and concerns but also to detect new conditions early and prevent illness, disease and health-related problems.    Medicare offers a yearly Wellness Visit which allows our clinical staff to assess your need for preventative services including immunizations, lifestyle education, counseling to decrease risk of preventable diseases and screening for fall risk and other medical concerns.    This visit is provided free of charge (no copay) for all Medicare recipients. The clinical pharmacists at [facility] have begun to conduct these Wellness Visits which will also include a thorough review of all your medications.    As you primary medical provider recommend that you make an appointment for your Annual Wellness Visit if you have not done so already this year.  You may set up this appointment before you leave today or you may call back [phone]) and schedule an appointment.  Please make sure when you call that you mention that you are scheduling your Annual Wellness Visit with the clinical pharmacist so that the appointment may be made for the proper length of time.     Continue current medications. Continue good therapeutic lifestyle changes which include good diet and exercise. Fall precautions discussed with patient. If an FOBT was given today- please return it to our front desk. If you are over 55 years old - you may need Prevnar 68 or the adult Pneumonia vaccine.  **Flu shots are available--- please call and schedule a FLU-CLINIC appointment**  After your visit with [place] today you will receive a survey in the mail or online from [employer] regarding your care with [place]. Please take a moment to fill this out. Your feedback is very  important to [place] as you can help [place] better understand your patient needs as well as improve your experience and satisfaction. WE CARE ABOUT YOU!!!   We will check on the date of the last exercise treadmill test and consider doing this again if it has been greater than 4 years. Continue with aggressive therapeutic lifestyle changes Call you with the results of the lab work once these results become available

## 2015-06-21 ENCOUNTER — Other Ambulatory Visit: Payer: Medicare Other

## 2015-06-21 DIAGNOSIS — Z1211 Encounter for screening for malignant neoplasm of colon: Secondary | ICD-10-CM

## 2015-06-21 LAB — CBC WITH DIFFERENTIAL/PLATELET
BASOS: 0 %
Basophils Absolute: 0 10*3/uL (ref 0.0–0.2)
EOS (ABSOLUTE): 0 10*3/uL (ref 0.0–0.4)
EOS: 1 %
HEMATOCRIT: 42.8 % (ref 37.5–51.0)
Hemoglobin: 14.4 g/dL (ref 12.6–17.7)
IMMATURE GRANS (ABS): 0 10*3/uL (ref 0.0–0.1)
Immature Granulocytes: 0 %
Lymphocytes Absolute: 1.9 10*3/uL (ref 0.7–3.1)
Lymphs: 28 %
MCH: 33.5 pg — AB (ref 26.6–33.0)
MCHC: 33.6 g/dL (ref 31.5–35.7)
MCV: 100 fL — AB (ref 79–97)
MONOS ABS: 0.6 10*3/uL (ref 0.1–0.9)
Monocytes: 9 %
NEUTROS ABS: 4.4 10*3/uL (ref 1.4–7.0)
Neutrophils: 62 %
PLATELETS: 323 10*3/uL (ref 150–379)
RBC: 4.3 x10E6/uL (ref 4.14–5.80)
RDW: 13.9 % (ref 12.3–15.4)
WBC: 7 10*3/uL (ref 3.4–10.8)

## 2015-06-21 LAB — BMP8+EGFR
BUN/Creatinine Ratio: 15 (ref 10–24)
BUN: 15 mg/dL (ref 8–27)
CALCIUM: 9.1 mg/dL (ref 8.6–10.2)
CHLORIDE: 97 mmol/L (ref 96–106)
CO2: 24 mmol/L (ref 18–29)
Creatinine, Ser: 1 mg/dL (ref 0.76–1.27)
GFR calc Af Amer: 86 mL/min/{1.73_m2} (ref >59)
GFR calc non Af Amer: 74 mL/min/{1.73_m2} (ref >59)
Glucose: 86 mg/dL (ref 65–99)
POTASSIUM: 4.4 mmol/L (ref 3.5–5.2)
Sodium: 137 mmol/L (ref 134–144)

## 2015-06-21 LAB — NMR, LIPOPROFILE
Cholesterol: 123 mg/dL (ref 100–199)
HDL Cholesterol by NMR: 51 mg/dL (ref >39)
HDL Particle Number: 32.5 umol/L (ref >=30.5)
LDL PARTICLE NUMBER: 680 nmol/L (ref <1000)
LDL SIZE: 20 nm (ref >20.5)
LDL-C: 57 mg/dL (ref 0–99)
LP-IR Score: 39 (ref <=45)
SMALL LDL PARTICLE NUMBER: 325 nmol/L (ref <=527)
TRIGLYCERIDES BY NMR: 76 mg/dL (ref 0–149)

## 2015-06-21 LAB — HEPATIC FUNCTION PANEL
ALT: 19 IU/L (ref 0–44)
AST: 30 IU/L (ref 0–40)
Albumin: 4.3 g/dL (ref 3.5–4.8)
Alkaline Phosphatase: 77 IU/L (ref 39–117)
BILIRUBIN TOTAL: 1.3 mg/dL — AB (ref 0.0–1.2)
BILIRUBIN, DIRECT: 0.31 mg/dL (ref 0.00–0.40)
Total Protein: 6.8 g/dL (ref 6.0–8.5)

## 2015-06-21 LAB — VITAMIN D 25 HYDROXY (VIT D DEFICIENCY, FRACTURES): VIT D 25 HYDROXY: 47.3 ng/mL (ref 30.0–100.0)

## 2015-06-22 LAB — FECAL OCCULT BLOOD, IMMUNOCHEMICAL: Fecal Occult Bld: NEGATIVE

## 2015-06-28 ENCOUNTER — Telehealth: Payer: Self-pay | Admitting: *Deleted

## 2015-06-28 ENCOUNTER — Ambulatory Visit: Payer: Medicare Other | Admitting: Family Medicine

## 2015-06-28 DIAGNOSIS — E785 Hyperlipidemia, unspecified: Secondary | ICD-10-CM

## 2015-06-28 DIAGNOSIS — I10 Essential (primary) hypertension: Secondary | ICD-10-CM

## 2015-06-28 DIAGNOSIS — E118 Type 2 diabetes mellitus with unspecified complications: Secondary | ICD-10-CM

## 2015-06-28 NOTE — Telephone Encounter (Signed)
Pt aware that per [facility] and cardio - he needs ETT -- he is moving out of [place].  He is coming back in [place] for several appts; - wants to do it then at cardio office.

## 2015-06-29 ENCOUNTER — Other Ambulatory Visit: Payer: Self-pay

## 2015-06-29 DIAGNOSIS — E8881 Metabolic syndrome: Secondary | ICD-10-CM

## 2015-07-10 ENCOUNTER — Other Ambulatory Visit: Payer: Self-pay | Admitting: Family Medicine

## 2015-07-12 ENCOUNTER — Other Ambulatory Visit: Payer: Medicare Other

## 2015-07-12 DIAGNOSIS — R7989 Other specified abnormal findings of blood chemistry: Secondary | ICD-10-CM | POA: Diagnosis not present

## 2015-07-12 DIAGNOSIS — R945 Abnormal results of liver function studies: Principal | ICD-10-CM

## 2015-07-13 LAB — HEPATIC FUNCTION PANEL
ALT: 20 IU/L (ref 0–44)
AST: 27 IU/L (ref 0–40)
Albumin: 3.8 g/dL (ref 3.5–4.8)
Alkaline Phosphatase: 73 IU/L (ref 39–117)
BILIRUBIN TOTAL: 1.2 mg/dL (ref 0.0–1.2)
BILIRUBIN, DIRECT: 0.3 mg/dL (ref 0.00–0.40)
Total Protein: 6.3 g/dL (ref 6.0–8.5)

## 2015-10-03 ENCOUNTER — Other Ambulatory Visit: Payer: Self-pay | Admitting: Family Medicine

## 2015-10-03 MED ORDER — PIOGLITAZONE HCL 30 MG PO TABS: 30.0000 mg | ORAL_TABLET | Freq: Every day | ORAL | 0 refills | Status: DC

## 2015-10-03 NOTE — Telephone Encounter (Signed)
1 month supply sent to pharmacy 

## 2015-10-11 ENCOUNTER — Encounter: Payer: Self-pay | Admitting: Family Medicine

## 2015-10-20 ENCOUNTER — Encounter: Payer: Self-pay | Admitting: Family Medicine

## 2015-10-23 ENCOUNTER — Other Ambulatory Visit: Payer: Medicare Other

## 2015-10-23 DIAGNOSIS — N400 Enlarged prostate without lower urinary tract symptoms: Secondary | ICD-10-CM

## 2015-10-23 DIAGNOSIS — E559 Vitamin D deficiency, unspecified: Secondary | ICD-10-CM

## 2015-10-23 DIAGNOSIS — E118 Type 2 diabetes mellitus with unspecified complications: Secondary | ICD-10-CM

## 2015-10-23 DIAGNOSIS — E785 Hyperlipidemia, unspecified: Secondary | ICD-10-CM

## 2015-10-23 DIAGNOSIS — I10 Essential (primary) hypertension: Secondary | ICD-10-CM

## 2015-10-23 LAB — BAYER DCA HB A1C WAIVED: HB A1C: 5.4 % (ref <7.0)

## 2015-10-24 ENCOUNTER — Encounter: Payer: Self-pay | Admitting: Family Medicine

## 2015-10-24 ENCOUNTER — Ambulatory Visit (INDEPENDENT_AMBULATORY_CARE_PROVIDER_SITE_OTHER): Payer: Medicare Other | Admitting: Family Medicine

## 2015-10-24 VITALS — BP 106/56 | HR 69 | Temp 97.7°F | Ht 68.0 in | Wt 198.0 lb

## 2015-10-24 DIAGNOSIS — E785 Hyperlipidemia, unspecified: Secondary | ICD-10-CM

## 2015-10-24 DIAGNOSIS — R972 Elevated prostate specific antigen [PSA]: Secondary | ICD-10-CM

## 2015-10-24 DIAGNOSIS — I10 Essential (primary) hypertension: Secondary | ICD-10-CM

## 2015-10-24 DIAGNOSIS — Z23 Encounter for immunization: Secondary | ICD-10-CM

## 2015-10-24 DIAGNOSIS — N400 Enlarged prostate without lower urinary tract symptoms: Secondary | ICD-10-CM

## 2015-10-24 DIAGNOSIS — E559 Vitamin D deficiency, unspecified: Secondary | ICD-10-CM

## 2015-10-24 DIAGNOSIS — E8881 Metabolic syndrome: Secondary | ICD-10-CM

## 2015-10-24 DIAGNOSIS — Z0000 Encounter for general adult medical examination without abnormal findings: Secondary | ICD-10-CM | POA: Diagnosis not present

## 2015-10-24 LAB — HEPATIC FUNCTION PANEL
ALK PHOS: 85 IU/L (ref 39–117)
ALT: 19 IU/L (ref 0–44)
AST: 26 IU/L (ref 0–40)
Albumin: 4 g/dL (ref 3.5–4.8)
BILIRUBIN, DIRECT: 0.25 mg/dL (ref 0.00–0.40)
Bilirubin Total: 1.1 mg/dL (ref 0.0–1.2)
Total Protein: 6.4 g/dL (ref 6.0–8.5)

## 2015-10-24 LAB — CBC WITH DIFFERENTIAL/PLATELET
BASOS ABS: 0 10*3/uL (ref 0.0–0.2)
BASOS: 1 %
EOS (ABSOLUTE): 0.2 10*3/uL (ref 0.0–0.4)
EOS: 3 %
HEMOGLOBIN: 14.5 g/dL (ref 12.6–17.7)
Hematocrit: 43 % (ref 37.5–51.0)
Immature Grans (Abs): 0 10*3/uL (ref 0.0–0.1)
Immature Granulocytes: 0 %
Lymphocytes Absolute: 2.5 10*3/uL (ref 0.7–3.1)
Lymphs: 40 %
MCH: 33.6 pg — AB (ref 26.6–33.0)
MCHC: 33.7 g/dL (ref 31.5–35.7)
MCV: 100 fL — AB (ref 79–97)
MONOCYTES: 7 %
MONOS ABS: 0.4 10*3/uL (ref 0.1–0.9)
NEUTROS PCT: 49 %
Neutrophils Absolute: 3.1 10*3/uL (ref 1.4–7.0)
Platelets: 304 10*3/uL (ref 150–379)
RBC: 4.32 x10E6/uL (ref 4.14–5.80)
RDW: 13.2 % (ref 12.3–15.4)
WBC: 6.2 10*3/uL (ref 3.4–10.8)

## 2015-10-24 LAB — BMP8+EGFR
BUN / CREAT RATIO: 18 (ref 10–24)
BUN: 20 mg/dL (ref 8–27)
CO2: 25 mmol/L (ref 18–29)
CREATININE: 1.14 mg/dL (ref 0.76–1.27)
Calcium: 9.1 mg/dL (ref 8.6–10.2)
Chloride: 99 mmol/L (ref 96–106)
GFR, EST AFRICAN AMERICAN: 73 mL/min/{1.73_m2} (ref >59)
GFR, EST NON AFRICAN AMERICAN: 63 mL/min/{1.73_m2} (ref >59)
Glucose: 94 mg/dL (ref 65–99)
Potassium: 5.1 mmol/L (ref 3.5–5.2)
Sodium: 137 mmol/L (ref 134–144)

## 2015-10-24 LAB — PSA, TOTAL AND FREE
PROSTATE SPECIFIC AG, SERUM: 3.1 ng/mL (ref 0.0–4.0)
PSA, Free Pct: 18.7 %
PSA, Free: 0.58 ng/mL

## 2015-10-24 LAB — NMR, LIPOPROFILE
Cholesterol: 139 mg/dL (ref 100–199)
HDL Cholesterol by NMR: 23 mg/dL — ABNORMAL LOW (ref >39)
HDL PARTICLE NUMBER: 30.6 umol/L (ref >=30.5)
LDL Particle Number: 1005 nmol/L — ABNORMAL HIGH (ref <1000)
LDL SIZE: 21.9 nm (ref >20.5)
LDL-C: 99 mg/dL (ref 0–99)
LP-IR Score: 36 (ref <=45)
SMALL LDL PARTICLE NUMBER: 348 nmol/L (ref <=527)
TRIGLYCERIDES BY NMR: 83 mg/dL (ref 0–149)

## 2015-10-24 LAB — VITAMIN D 25 HYDROXY (VIT D DEFICIENCY, FRACTURES): Vit D, 25-Hydroxy: 46.7 ng/mL (ref 30.0–100.0)

## 2015-10-24 NOTE — Patient Instructions (Addendum)
Medicare Annual Wellness Visit  [place] and the medical providers at [facility] strive to bring you the best medical care.  In doing so we not only want to address your current medical conditions and concerns but also to detect new conditions early and prevent illness, disease and health-related problems.    Medicare offers a yearly Wellness Visit which allows our clinical staff to assess your need for preventative services including immunizations, lifestyle education, counseling to decrease risk of preventable diseases and screening for fall risk and other medical concerns.    This visit is provided free of charge (no copay) for all Medicare recipients. The clinical pharmacists at [facility] have begun to conduct these Wellness Visits which will also include a thorough review of all your medications.    As you primary medical provider recommend that you make an appointment for your Annual Wellness Visit if you have not done so already this year.  You may set up this appointment before you leave today or you may call back [phone] and schedule an appointment.  Please make sure when you call that you mention that you are scheduling your Annual Wellness Visit with the clinical pharmacist so that the appointment may be made for the proper length of time.     Continue current medications. Continue good therapeutic lifestyle changes which include good diet and exercise. Fall precautions discussed with patient. If an FOBT was given today- please return it to our front desk. If you are over 26 years old - you may need Prevnar 34 or the adult Pneumonia vaccine.  **Flu shots are available--- please call and schedule a FLU-CLINIC appointment**  After your visit with [place] today you will receive a survey in the mail or online from [employer] regarding your care with [place]. Please take a moment to fill this out. Your feedback is very  important to [place] as you can help [place] better understand your patient needs as well as improve your experience and satisfaction. WE CARE ABOUT YOU!!!   The flu shot today you'll receive today may make your arm sore Please send [place] a copy of your eye exam report once that exam is completed Keep visit with dermatology and keep skin checks done regularly Stay active physically and eat healthy Drink plenty of fluids and stay well hydrated Get treadmill as planned

## 2015-10-24 NOTE — Progress Notes (Signed)
Subjective:    Patient ID: Mario Powers, male    DOB: 05-17-1940, 75 y.o.   MRN: [id number]  HPI Patient is here today for annual wellness exam and follow up of chronic medical problems which includes metabolic syndrome, hyperlipidemia and hypertension. He is taking medications regularly.The patient is now living in [facility]. He denies any chest pain pressure or palpitations. He denies any shortness of breath or PND. He denies any problems with his GI tract including nausea vomiting heartburn swallowing blood in the stool or black tarry bowel movements. His last colonoscopy was in September 2015. He's passing his water well without problems and having no problems with erectile dysfunction. His lab work was reviewed with him and the only change that was of significance was that his total LDL particle number went from 600 range cyst 02/998 and the only thing we can figure that made a difference with this was the fact that he stopped taking his Niaspan which became so expensive. We will continue to monitor this and he will check back with his insurance company to make sure that it is remaining that expensive. He will get a urinalysis today and a flu shot today. It is worth mentioning that he does have a stressed tests scheduled on the 28th of this month in [place]. He is also due to get his eye exam soon and will have a sent a copy of that report.     Patient Active Problem List   Diagnosis Date Noted  . Puncture wound of lower leg, left 09/09/2014  . Metabolic syndrome [id number]  . Elevated blood sugar 01/13/2013  . Personal history of colonic polyps 01/13/2013  . Hyperlipidemia 07/22/2012  . Hypertension 07/22/2012  . BPH (benign prostatic hypertrophy) 07/22/2012  . Vitamin D deficiency 07/22/2012   Outpatient Encounter Prescriptions as of 10/24/2015  Medication Sig  . aspirin 81 MG tablet Take 81 mg by mouth daily.  Marland Kitchen atorvastatin (LIPITOR) 80 MG tablet TAKE 1 TABLET BY  MOUTH DAILY.  Marland Kitchen Cholecalciferol (VITAMIN D) 2000 UNITS CAPS Take 1 capsule by mouth daily.  Marland Kitchen losartan-hydrochlorothiazide (HYZAAR) 100-12.5 MG tablet TAKE 1 TABLET BY MOUTH ONCE A DAY  . Multiple Vitamin (MULTIVITAMIN) tablet Take 1 tablet by mouth daily.  . Omega-3 Fatty Acids (FISH OIL) 1200 MG CAPS Take 1,200 mg by mouth 2 (two) times daily.  . pioglitazone (ACTOS) 30 MG tablet Take 1 tablet (30 mg total) by mouth daily.  . [DISCONTINUED] fluorouracil (EFUDEX) 5 % cream Apply topically 2 (two) times daily.   No facility-administered encounter medications on file as of 10/24/2015.       Review of Systems  Constitutional: Negative.   HENT: Negative.   Eyes: Negative.   Respiratory: Negative.   Cardiovascular: Negative.   Gastrointestinal: Negative.   Endocrine: Negative.   Genitourinary: Negative.   Musculoskeletal: Negative.   Skin: Negative.   Allergic/Immunologic: Negative.   Neurological: Negative.   Hematological: Negative.   Psychiatric/Behavioral: Negative.        Objective:   Physical Exam  Constitutional: He is oriented to person, place, and time. He appears well-developed and well-nourished. No distress.  HENT:  Head: Normocephalic and atraumatic.  Right Ear: External ear normal.  Left Ear: External ear normal.  Nose: Nose normal.  Mouth/Throat: Oropharynx is clear and moist. No oropharyngeal exudate.  Eyes: Conjunctivae and EOM are normal. Pupils are equal, round, and reactive to light. Right eye exhibits no discharge. Left eye exhibits no discharge. No scleral  icterus.  Neck: Normal range of motion. Neck supple. No thyromegaly present.  No thyromegaly bruits or anterior cervical adenopathy  Cardiovascular: Normal rate, regular rhythm, normal heart sounds and intact distal pulses.   No murmur heard. Heart is regular at 72/m  Pulmonary/Chest: Effort normal and breath sounds normal. No respiratory distress. He has no wheezes. He has no rales. He exhibits no  tenderness.  Clear anteriorly and posteriorly and no axillary adenopathy  Abdominal: Soft. Bowel sounds are normal. He exhibits no mass. There is no tenderness. There is no rebound and no guarding.  No liver or spleen enlargement no bruits and no masses and no inguinal adenopathy  Genitourinary: Rectum normal and penis normal.  Genitourinary Comments: The prostate is slightly enlarged but soft and smooth. There are no rectal masses. The external genitalia were within normal limits. There is no inguinal adenopathy and no hernias were palpated.  Musculoskeletal: Normal range of motion. He exhibits no edema.  Lymphadenopathy:    He has no cervical adenopathy.  Neurological: He is alert and oriented to person, place, and time. He has normal reflexes. No cranial nerve deficit.  Skin: Skin is warm and dry. No rash noted.  Keep appointment with dermatology for general screen  Psychiatric: He has a normal mood and affect. His behavior is normal. Judgment and thought content normal.  Nursing note and vitals reviewed.   BP (!) 106/56 (BP Location: Left Arm)   Pulse 69   Temp 97.7 F (36.5 C) (Oral)   Ht 5\' 8"  (1.727 m)   Wt 198 lb (89.8 kg)   BMI 30.11 kg/m        Assessment & Plan:  1. Essential hypertension -The blood pressure is good today and the patient will continue with current treatment  2. Hyperlipidemia -The total LDL particle number was increased and the only thing that is different now compared to previously is at the patient is no longer taking Niaspan. We will continue to emphasize therapeutic lifestyle changes and on follow-up in the future we'll make a decision if we have to go back and try the Niaspan again.  3. Vitamin D deficiency -Continue current treatment  4. BPH (benign prostatic hypertrophy) -The prostate is slightly enlarged without any lumps or masses and the patient is not having any symptoms passing his water.  5. Annual physical exam -Get treadmill as  planned  6. Metabolic syndrome -Continue to work on diet and exercise and always try to do better with therapeutic lifestyle changes  7. Encounter for immunization - Flu vaccine HIGH DOSE PF  8. Elevated PSA -This is actually lower than it was one year ago and we will continue to monitor this periodically -The patient has had his PSA to be high and be lower for several years.  Patient Instructions                       Medicare Annual Wellness Visit  [place] and the medical providers at [facility] strive to bring you the best medical care.  In doing so we not only want to address your current medical conditions and concerns but also to detect new conditions early and prevent illness, disease and health-related problems.    Medicare offers a yearly Wellness Visit which allows our clinical staff to assess your need for preventative services including immunizations, lifestyle education, counseling to decrease risk of preventable diseases and screening for fall risk and other medical concerns.  This visit is provided free of charge (no copay) for all Medicare recipients. The clinical pharmacists at [facility] have begun to conduct these Wellness Visits which will also include a thorough review of all your medications.    As you primary medical provider recommend that you make an appointment for your Annual Wellness Visit if you have not done so already this year.  You may set up this appointment before you leave today or you may call back [phone]) and schedule an appointment.  Please make sure when you call that you mention that you are scheduling your Annual Wellness Visit with the clinical pharmacist so that the appointment may be made for the proper length of time.     Continue current medications. Continue good therapeutic lifestyle changes which include good diet and exercise. Fall precautions discussed with patient. If an FOBT was  given today- please return it to our front desk. If you are over 21 years old - you may need Prevnar 81 or the adult Pneumonia vaccine.  **Flu shots are available--- please call and schedule a FLU-CLINIC appointment**  After your visit with [place] today you will receive a survey in the mail or online from [employer] regarding your care with [place]. Please take a moment to fill this out. Your feedback is very important to [place] as you can help [place] better understand your patient needs as well as improve your experience and satisfaction. WE CARE ABOUT YOU!!!   The flu shot today you'll receive today may make your arm sore Please send [place] a copy of your eye exam report once that exam is completed Keep visit with dermatology and keep skin checks done regularly Stay active physically and eat healthy Drink plenty of fluids and stay well hydrated Get treadmill as planned    Arrie Senate MD

## 2015-10-26 ENCOUNTER — Other Ambulatory Visit: Payer: Self-pay | Admitting: Physician Assistant

## 2015-10-26 ENCOUNTER — Ambulatory Visit (INDEPENDENT_AMBULATORY_CARE_PROVIDER_SITE_OTHER): Payer: Medicare Other

## 2015-10-26 DIAGNOSIS — E8881 Metabolic syndrome: Secondary | ICD-10-CM | POA: Diagnosis not present

## 2015-10-26 LAB — EXERCISE TOLERANCE TEST
CHL CUP RESTING HR STRESS: 83 {beats}/min
CSEPED: 6 min
CSEPEW: 7 METS
Exercise duration (sec): 0 s
MPHR: 146 {beats}/min
Peak HR: 151 {beats}/min
Percent HR: 103 %
RPE: 16

## 2015-10-31 ENCOUNTER — Other Ambulatory Visit: Payer: Self-pay | Admitting: *Deleted

## 2015-10-31 MED ORDER — ATORVASTATIN CALCIUM 80 MG PO TABS: 80.0000 mg | ORAL_TABLET | Freq: Every day | ORAL | 11 refills | Status: DC

## 2015-11-09 ENCOUNTER — Other Ambulatory Visit: Payer: Self-pay | Admitting: Family Medicine

## 2015-12-15 LAB — HM DIABETES EYE EXAM

## 2016-01-06 ENCOUNTER — Other Ambulatory Visit: Payer: Self-pay | Admitting: Family Medicine

## 2016-02-29 ENCOUNTER — Other Ambulatory Visit: Payer: Self-pay | Admitting: Physician Assistant

## 2016-04-07 ENCOUNTER — Other Ambulatory Visit: Payer: Self-pay | Admitting: Family Medicine

## 2016-04-23 ENCOUNTER — Ambulatory Visit: Payer: Medicare Other | Admitting: Family Medicine

## 2016-05-06 ENCOUNTER — Other Ambulatory Visit: Payer: Self-pay | Admitting: Family Medicine

## 2016-05-07 ENCOUNTER — Ambulatory Visit (INDEPENDENT_AMBULATORY_CARE_PROVIDER_SITE_OTHER): Payer: Medicare Other

## 2016-05-07 ENCOUNTER — Ambulatory Visit (INDEPENDENT_AMBULATORY_CARE_PROVIDER_SITE_OTHER): Payer: Medicare Other | Admitting: Family Medicine

## 2016-05-07 ENCOUNTER — Encounter: Payer: Self-pay | Admitting: Family Medicine

## 2016-05-07 VITALS — BP 116/69 | HR 73 | Temp 97.0°F | Ht 68.0 in | Wt 206.0 lb

## 2016-05-07 DIAGNOSIS — R972 Elevated prostate specific antigen [PSA]: Secondary | ICD-10-CM

## 2016-05-07 DIAGNOSIS — I10 Essential (primary) hypertension: Secondary | ICD-10-CM

## 2016-05-07 DIAGNOSIS — E559 Vitamin D deficiency, unspecified: Secondary | ICD-10-CM

## 2016-05-07 DIAGNOSIS — R635 Abnormal weight gain: Secondary | ICD-10-CM

## 2016-05-07 DIAGNOSIS — E7800 Pure hypercholesterolemia, unspecified: Secondary | ICD-10-CM

## 2016-05-07 DIAGNOSIS — E118 Type 2 diabetes mellitus with unspecified complications: Secondary | ICD-10-CM | POA: Diagnosis not present

## 2016-05-07 DIAGNOSIS — E8881 Metabolic syndrome: Secondary | ICD-10-CM | POA: Diagnosis not present

## 2016-05-07 LAB — BAYER DCA HB A1C WAIVED: HB A1C (BAYER DCA - WAIVED): 5.3 % (ref <7.0)

## 2016-05-07 NOTE — Progress Notes (Signed)
Subjective:    Patient ID: Mario Powers, male    DOB: Jun 16, 1940, 76 y.o.   MRN: [id number]  HPI Pt here for follow up and management of chronic medical problems which includes hyperlipidemia and hypertension. He is taking medication regularly.The patient is doing well overall. He does complain of a right wrist lesion and some pain in the right heel. He is not requiring any refills on his medicines. His vital signs are stable but his weight is up 8 pounds. He will be given an FOBT to return and will get a chest x-ray today and will get lab work today. Patient has had periodic elevations of his PSA. We will make sure that we also get a PSA today because of this. The patient did have a colonoscopy in September 2015 and was told that he needed another one in 5 years. He denies any chest pain or shortness of breath. He denies any trouble with his intestinal tract including nausea vomiting diarrhea blood in the stool or black tarry bowel movements. He is passing his water well. He he does have a recurring area of skin irritation on the right wrist that has seen the dermatologist for. We have done cryotherapy and so has the dermatologist. He has a plan to see the dermatologist in about 3 months and we will do cryotherapy today and if it's not better at that time he may want the dermatologist to do a biopsy of this. He also has done a lot of walking recently and is developed more pain in the plantar surface of his right heel. I do not see any skin lesions in this area. We will encourage him to use heel cups and issues and takes many inflammatory medicines if this pain can be relieved.    Patient Active Problem List   Diagnosis Date Noted  . Puncture wound of lower leg, left 09/09/2014  . Metabolic syndrome 26/71/2458  . Elevated blood sugar 01/13/2013  . Personal history of colonic polyps 01/13/2013  . Hyperlipidemia 07/22/2012  . Hypertension 07/22/2012  . BPH (benign prostatic hypertrophy)  07/22/2012  . Vitamin D deficiency 07/22/2012   Outpatient Encounter Prescriptions as of 05/07/2016  Medication Sig  . aspirin 81 MG tablet Take 81 mg by mouth daily.  Marland Kitchen atorvastatin (LIPITOR) 80 MG tablet Take 1 tablet (80 mg total) by mouth daily.  . Cholecalciferol (VITAMIN D) 2000 UNITS CAPS Take 1 capsule by mouth daily.  Marland Kitchen losartan-hydrochlorothiazide (HYZAAR) 100-12.5 MG tablet TAKE 1 TABLET BY MOUTH ONCE A DAY  . Multiple Vitamin (MULTIVITAMIN) tablet Take 1 tablet by mouth daily.  . Omega-3 Fatty Acids (FISH OIL) 1200 MG CAPS Take 1,200 mg by mouth 2 (two) times daily.  . pioglitazone (ACTOS) 30 MG tablet TAKE 1 TABLET (30 MG TOTAL) BY MOUTH DAILY.   No facility-administered encounter medications on file as of 05/07/2016.       Review of Systems  Constitutional: Negative.   HENT: Negative.   Eyes: Negative.   Respiratory: Negative.   Cardiovascular: Negative.   Gastrointestinal: Negative.   Endocrine: Negative.   Genitourinary: Negative.   Musculoskeletal: Positive for arthralgias (right heel pain).  Skin: Negative.        Right wrist skin lesion  Allergic/Immunologic: Negative.   Neurological: Negative.   Hematological: Negative.   Psychiatric/Behavioral: Negative.        Objective:   Physical Exam  Constitutional: He is oriented to person, place, and time. He appears well-developed and well-nourished. No distress.  HENT:  Head: Normocephalic and atraumatic.  Right Ear: External ear normal.  Left Ear: External ear normal.  Nose: Nose normal.  Mouth/Throat: Oropharynx is clear and moist. No oropharyngeal exudate.  Eyes: Conjunctivae and EOM are normal. Pupils are equal, round, and reactive to light. Right eye exhibits no discharge. Left eye exhibits no discharge. No scleral icterus.  Neck: Normal range of motion. Neck supple. No thyromegaly present.  No bruits thyromegaly or anterior cervical adenopathy  Cardiovascular: Normal rate, regular rhythm, normal heart  sounds and intact distal pulses.   No murmur heard. Heart has a regular rate and rhythm at 72/m  Pulmonary/Chest: Effort normal and breath sounds normal. No respiratory distress. He has no wheezes. He has no rales. He exhibits no tenderness.  We are anteriorly and posteriorly no axillary adenopathy and no chest wall tenderness or masses.  Abdominal: Soft. Bowel sounds are normal. He exhibits no mass. There is no tenderness. There is no rebound and no guarding.  No abdominal tenderness masses or organ enlargement or bruits  Genitourinary: Rectum normal.  Musculoskeletal: Normal range of motion. He exhibits no edema.  No tenderness was elicited by pressing on the heel of the right foot.  Lymphadenopathy:    He has no cervical adenopathy.  Neurological: He is alert and oriented to person, place, and time. He has normal reflexes. No cranial nerve deficit.  Skin: Skin is warm and dry. No rash noted.  Therapy to seborrheic keratosis type lesion on the right wrist  Psychiatric: He has a normal mood and affect. His behavior is normal. Judgment and thought content normal.  Nursing note and vitals reviewed.  BP 116/69 (BP Location: Left Arm)   Pulse 73   Temp 97 F (36.1 C) (Oral)   Ht '5\' 8"'$  (1.727 m)   Wt 206 lb (93.4 kg)   BMI 31.32 kg/m   Chest x-ray with results pending===      Assessment & Plan:  1. Essential hypertension -The blood pressure is good today and he will continue with current treatment - BMP8+EGFR - CBC with Differential/Platelet - Hepatic function panel - DG Chest 2 View; Future  2. Pure hypercholesterolemia -Continue with atorvastatin and aggressive therapeutic lifestyle changes pending results of lab work - CBC with Differential/Platelet - NMR, lipoprofile - DG Chest 2 View; Future  3. Metabolic syndrome -Make all efforts at losing weight and watching diet more closely. Exercise regularly  4. Vitamin D deficiency -Continue current treatment pending results  of lab work - CBC with Differential/Platelet - VITAMIN D 25 Hydroxy (Vit-D Deficiency, Fractures)  5. Type 2 diabetes mellitus with complication, without long-term current use of insulin (HCC) -The patient continues to take Actos for metabolic syndrome. - CBC with Differential/Platelet - Bayer DCA Hb A1c Waived  6. Elevated PSA -The patient's PSA has waffle up and down for years and he has seen the urologist periodically in the past. - PSA, total and free  7. Weight gain -He understands that he is gained 8 pounds of weight and needs to work on getting this off as soon as possible  Patient Instructions                       Medicare Annual Wellness Visit  [facility] and the medical providers at [facility] strive to bring you the best medical care.  In doing so we not only want to address your current medical conditions and concerns but also to detect new conditions  early and prevent illness, disease and health-related problems.    Medicare offers a yearly Wellness Visit which allows our clinical staff to assess your need for preventative services including immunizations, lifestyle education, counseling to decrease risk of preventable diseases and screening for fall risk and other medical concerns.    This visit is provided free of charge (no copay) for all Medicare recipients. The clinical pharmacists at [facility] have begun to conduct these Wellness Visits which will also include a thorough review of all your medications.    As you primary medical provider recommend that you make an appointment for your Annual Wellness Visit if you have not done so already this year.  You may set up this appointment before you leave today or you may call back ([phone]) and schedule an appointment.  Please make sure when you call that you mention that you are scheduling your Annual Wellness Visit with the clinical pharmacist so that the appointment may be  made for the proper length of time.     Continue current medications. Continue good therapeutic lifestyle changes which include good diet and exercise. Fall precautions discussed with patient. If an FOBT was given today- please return it to our front desk. If you are over 13 years old - you may need Prevnar 5 or the adult Pneumonia vaccine.  **Flu shots are available--- please call and schedule a FLU-CLINIC appointment**  After your visit with [place] today you will receive a survey in the mail or online from [employer] regarding your care with [place]. Please take a moment to fill this out. Your feedback is very important to [place] as you can help [place] better understand your patient needs as well as improve your experience and satisfaction. WE CARE ABOUT YOU!!!   Patient needs to increase his physical activity and watch his diet more closely. He should always drink plenty of fluids and stay well hydrated Take some Aleve for a short period of time and so the heel and some warm water not hot. Take Aleve twice daily after breakfast and supper since this doesn't take care of some of the soreness in the right heel. Retry the heel cups. Follow-up with dermatology regarding the wrist lesion if it continues to be persistent. It will need to be removed.   Arrie Senate MD

## 2016-05-07 NOTE — Patient Instructions (Addendum)
Medicare Annual Wellness Visit  [facility] and the medical providers at [facility] strive to bring you the best medical care.  In doing so we not only want to address your current medical conditions and concerns but also to detect new conditions early and prevent illness, disease and health-related problems.    Medicare offers a yearly Wellness Visit which allows our clinical staff to assess your need for preventative services including immunizations, lifestyle education, counseling to decrease risk of preventable diseases and screening for fall risk and other medical concerns.    This visit is provided free of charge (no copay) for all Medicare recipients. The clinical pharmacists at [facility] have begun to conduct these Wellness Visits which will also include a thorough review of all your medications.    As you primary medical provider recommend that you make an appointment for your Annual Wellness Visit if you have not done so already this year.  You may set up this appointment before you leave today or you may call back ([phone]) and schedule an appointment.  Please make sure when you call that you mention that you are scheduling your Annual Wellness Visit with the clinical pharmacist so that the appointment may be made for the proper length of time.     Continue current medications. Continue good therapeutic lifestyle changes which include good diet and exercise. Fall precautions discussed with patient. If an FOBT was given today- please return it to our front desk. If you are over 67 years old - you may need Prevnar 16 or the adult Pneumonia vaccine.  **Flu shots are available--- please call and schedule a FLU-CLINIC appointment**  After your visit with [place] today you will receive a survey in the mail or online from [employer] regarding your care with [place]. Please take a moment to fill this out. Your feedback is very  important to [place] as you can help [place] better understand your patient needs as well as improve your experience and satisfaction. WE CARE ABOUT YOU!!!   Patient needs to increase his physical activity and watch his diet more closely. He should always drink plenty of fluids and stay well hydrated Take some Aleve for a short period of time and so the heel and some warm water not hot. Take Aleve twice daily after breakfast and supper since this doesn't take care of some of the soreness in the right heel. Retry the heel cups. Follow-up with dermatology regarding the wrist lesion if it continues to be persistent. It will need to be removed.

## 2016-05-08 LAB — HEPATIC FUNCTION PANEL
ALBUMIN: 3.9 g/dL (ref 3.5–4.8)
ALK PHOS: 74 IU/L (ref 39–117)
ALT: 27 IU/L (ref 0–44)
AST: 27 IU/L (ref 0–40)
BILIRUBIN TOTAL: 1 mg/dL (ref 0.0–1.2)
BILIRUBIN, DIRECT: 0.26 mg/dL (ref 0.00–0.40)
Total Protein: 6.8 g/dL (ref 6.0–8.5)

## 2016-05-08 LAB — CBC WITH DIFFERENTIAL/PLATELET
BASOS: 1 %
Basophils Absolute: 0 10*3/uL (ref 0.0–0.2)
EOS (ABSOLUTE): 0.1 10*3/uL (ref 0.0–0.4)
EOS: 2 %
HEMATOCRIT: 45.1 % (ref 37.5–51.0)
HEMOGLOBIN: 15.1 g/dL (ref 13.0–17.7)
IMMATURE GRANS (ABS): 0.1 10*3/uL (ref 0.0–0.1)
IMMATURE GRANULOCYTES: 1 %
LYMPHS: 30 %
Lymphocytes Absolute: 2.4 10*3/uL (ref 0.7–3.1)
MCH: 33.6 pg — ABNORMAL HIGH (ref 26.6–33.0)
MCHC: 33.5 g/dL (ref 31.5–35.7)
MCV: 100 fL — AB (ref 79–97)
MONOCYTES: 9 %
Monocytes Absolute: 0.7 10*3/uL (ref 0.1–0.9)
Neutrophils Absolute: 4.7 10*3/uL (ref 1.4–7.0)
Neutrophils: 57 %
Platelets: 327 10*3/uL (ref 150–379)
RBC: 4.5 x10E6/uL (ref 4.14–5.80)
RDW: 13.6 % (ref 12.3–15.4)
WBC: 8 10*3/uL (ref 3.4–10.8)

## 2016-05-08 LAB — BMP8+EGFR
BUN/Creatinine Ratio: 15 (ref 10–24)
BUN: 18 mg/dL (ref 8–27)
CALCIUM: 8.8 mg/dL (ref 8.6–10.2)
CHLORIDE: 100 mmol/L (ref 96–106)
CO2: 25 mmol/L (ref 18–29)
CREATININE: 1.22 mg/dL (ref 0.76–1.27)
GFR, EST AFRICAN AMERICAN: 67 mL/min/{1.73_m2} (ref >59)
GFR, EST NON AFRICAN AMERICAN: 58 mL/min/{1.73_m2} — AB (ref >59)
Glucose: 89 mg/dL (ref 65–99)
Potassium: 4.3 mmol/L (ref 3.5–5.2)
Sodium: 139 mmol/L (ref 134–144)

## 2016-05-08 LAB — NMR, LIPOPROFILE
CHOLESTEROL: 157 mg/dL (ref 100–199)
HDL Cholesterol by NMR: 45 mg/dL (ref >39)
HDL PARTICLE NUMBER: 32 umol/L (ref >=30.5)
LDL Particle Number: 977 nmol/L (ref <1000)
LDL SIZE: 21.5 nm (ref >20.5)
LDL-C: 87 mg/dL (ref 0–99)
LP-IR SCORE: 41 (ref <=45)
SMALL LDL PARTICLE NUMBER: 370 nmol/L (ref <=527)
TRIGLYCERIDES BY NMR: 125 mg/dL (ref 0–149)

## 2016-05-08 LAB — PSA, TOTAL AND FREE
PSA, Free Pct: 22.4 %
PSA, Free: 0.65 ng/mL
Prostate Specific Ag, Serum: 2.9 ng/mL (ref 0.0–4.0)

## 2016-05-08 LAB — VITAMIN D 25 HYDROXY (VIT D DEFICIENCY, FRACTURES): Vit D, 25-Hydroxy: 39.6 ng/mL (ref 30.0–100.0)

## 2016-06-04 ENCOUNTER — Other Ambulatory Visit: Payer: Self-pay | Admitting: Family Medicine

## 2016-06-13 ENCOUNTER — Other Ambulatory Visit: Payer: Medicare Other

## 2016-06-13 DIAGNOSIS — Z1211 Encounter for screening for malignant neoplasm of colon: Secondary | ICD-10-CM

## 2016-06-14 LAB — FECAL OCCULT BLOOD, IMMUNOCHEMICAL: Fecal Occult Bld: NEGATIVE

## 2016-06-18 ENCOUNTER — Other Ambulatory Visit: Payer: Self-pay | Admitting: *Deleted

## 2016-06-18 ENCOUNTER — Encounter: Payer: Self-pay | Admitting: Family Medicine

## 2016-06-18 MED ORDER — LOSARTAN POTASSIUM-HCTZ 100-12.5 MG PO TABS: 1.0000 | ORAL_TABLET | Freq: Every day | ORAL | 1 refills | Status: DC

## 2016-10-31 ENCOUNTER — Other Ambulatory Visit: Payer: Self-pay | Admitting: Family Medicine

## 2016-11-04 ENCOUNTER — Ambulatory Visit: Payer: Medicare Other | Admitting: Family Medicine

## 2016-11-14 ENCOUNTER — Encounter: Payer: Self-pay | Admitting: Family Medicine

## 2016-11-14 ENCOUNTER — Ambulatory Visit (INDEPENDENT_AMBULATORY_CARE_PROVIDER_SITE_OTHER): Payer: Medicare Other | Admitting: Family Medicine

## 2016-11-14 VITALS — BP 114/69 | HR 73 | Temp 96.9°F | Ht 68.0 in | Wt 209.0 lb

## 2016-11-14 DIAGNOSIS — Z0000 Encounter for general adult medical examination without abnormal findings: Secondary | ICD-10-CM | POA: Diagnosis not present

## 2016-11-14 DIAGNOSIS — E8881 Metabolic syndrome: Secondary | ICD-10-CM

## 2016-11-14 DIAGNOSIS — H6121 Impacted cerumen, right ear: Secondary | ICD-10-CM

## 2016-11-14 DIAGNOSIS — I10 Essential (primary) hypertension: Secondary | ICD-10-CM

## 2016-11-14 DIAGNOSIS — E7800 Pure hypercholesterolemia, unspecified: Secondary | ICD-10-CM

## 2016-11-14 DIAGNOSIS — Z23 Encounter for immunization: Secondary | ICD-10-CM | POA: Diagnosis not present

## 2016-11-14 DIAGNOSIS — R8290 Unspecified abnormal findings in urine: Secondary | ICD-10-CM

## 2016-11-14 DIAGNOSIS — R972 Elevated prostate specific antigen [PSA]: Secondary | ICD-10-CM

## 2016-11-14 DIAGNOSIS — E118 Type 2 diabetes mellitus with unspecified complications: Secondary | ICD-10-CM

## 2016-11-14 DIAGNOSIS — E559 Vitamin D deficiency, unspecified: Secondary | ICD-10-CM

## 2016-11-14 LAB — URINALYSIS, COMPLETE
Bilirubin, UA: NEGATIVE
Glucose, UA: NEGATIVE
Ketones, UA: NEGATIVE
Nitrite, UA: NEGATIVE
PH UA: 6 (ref 5.0–7.5)
Protein, UA: NEGATIVE
Specific Gravity, UA: 1.015 (ref 1.005–1.030)
Urobilinogen, Ur: 0.2 mg/dL (ref 0.2–1.0)

## 2016-11-14 LAB — MICROSCOPIC EXAMINATION
EPITHELIAL CELLS (NON RENAL): NONE SEEN /HPF (ref 0–10)
Renal Epithel, UA: NONE SEEN /hpf

## 2016-11-14 LAB — BAYER DCA HB A1C WAIVED: HB A1C (BAYER DCA - WAIVED): 5.5 % (ref <7.0)

## 2016-11-14 MED ORDER — NEOMYCIN-POLYMYXIN-HC 3.5-10000-1 OT SOLN: 3.0000 [drp] | Freq: Four times a day (QID) | OTIC | 0 refills | Status: DC

## 2016-11-14 NOTE — Progress Notes (Signed)
Subjective:    Patient ID: Mario Powers, male    DOB: February 27, 1940, 76 y.o.   MRN: [id number]  HPI Patient is here today for annual wellness exam and follow up of chronic medical problems which includes hyperlipidemia, hypertension and diabetes. He is taking medication regularly.  He will is doing well overall.  He is here today for his physical exam.  His vital signs are stable.  He does complain of some itching in the right ear with some drainage.  He has a past history of elevated PSAs.  These have been fluctuating in the past and we will certainly follow-up on that again today.  He is also due to get his flu shot.  His last colonoscopy was in September 2015.  Patient says the ear has been bothering him for a couple of months with pruritus and clear drainage.  He did see an urgent care type mid-level who cleaned it out and irrigated it and told him to use Debrox or something similar to that periodically.  He is still having trouble with this.  He is not sure when his last stress test was.  He did have a colonoscopy in because of polyps and is due to get another colonoscopy 5 years after the previous one which would be about September 2020.  He denies any chest pain or shortness of breath.  He denies any trouble with his stomach including nausea vomiting diarrhea blood in the stool or black tarry bowel movements.  He is passing his water without problems.  He also had an exercise stress test in September 2017 which was normal and this was done by Dr. Sallyanne Kuster.     Patient Active Problem List   Diagnosis Date Noted  . Puncture wound of lower leg, left 09/09/2014  . Metabolic syndrome 24/58/0998  . Elevated blood sugar 01/13/2013  . Personal history of colonic polyps 01/13/2013  . Hyperlipidemia 07/22/2012  . Hypertension 07/22/2012  . BPH (benign prostatic hypertrophy) 07/22/2012  . Vitamin D deficiency 07/22/2012   Outpatient Encounter Prescriptions as of 11/14/2016  Medication Sig  .  aspirin 81 MG tablet Take 81 mg by mouth daily.  Marland Kitchen atorvastatin (LIPITOR) 80 MG tablet TAKE 1 TABLET (80 MG TOTAL) BY MOUTH DAILY.  Marland Kitchen Cholecalciferol (VITAMIN D) 2000 UNITS CAPS Take 1 capsule by mouth daily.  Marland Kitchen losartan-hydrochlorothiazide (HYZAAR) 100-12.5 MG tablet Take 1 tablet by mouth daily.  . Multiple Vitamin (MULTIVITAMIN) tablet Take 1 tablet by mouth daily.  . Omega-3 Fatty Acids (FISH OIL) 1200 MG CAPS Take 1,200 mg by mouth 2 (two) times daily.  . pioglitazone (ACTOS) 30 MG tablet TAKE 1 TABLET (30 MG TOTAL) BY MOUTH DAILY.   No facility-administered encounter medications on file as of 11/14/2016.      Review of Systems  Constitutional: Negative.   HENT: Positive for ear discharge (draining and itching - right ear).   Eyes: Negative.   Respiratory: Negative.   Cardiovascular: Negative.   Gastrointestinal: Negative.   Endocrine: Negative.   Genitourinary: Negative.   Musculoskeletal: Negative.   Skin: Negative.   Allergic/Immunologic: Negative.   Neurological: Negative.   Hematological: Negative.   Psychiatric/Behavioral: Negative.        Objective:   Physical Exam  Constitutional: He is oriented to person, place, and time. He appears well-developed and well-nourished. No distress.  Patient is pleasant and alert [place] that live in the community.  HENT:  Head: Normocephalic and atraumatic.  Left Ear: External ear normal.  Nose: Nose normal.  Mouth/Throat: Oropharynx is clear and moist. No oropharyngeal exudate.  Ear cerumen right ear canal and irrigation will be done today before he leaves the office.  Eyes: Pupils are equal, round, and reactive to light. Conjunctivae and EOM are normal. Right eye exhibits no discharge. Left eye exhibits no discharge. No scleral icterus.  Next eye exam will be in January and the patient says he has a history of early cataracts.  He will make sure that we get a copy  of that report.  Neck: Normal range of motion. Neck supple. No thyromegaly present.  No bruits thyromegaly or anterior cervical adenopathy  Cardiovascular: Normal rate, regular rhythm, normal heart sounds and intact distal pulses.   No murmur heard. Heart has a regular rate and rhythm at 72/min  Pulmonary/Chest: Effort normal and breath sounds normal. No respiratory distress. He has no wheezes. He has no rales. He exhibits no tenderness.  No chest wall masses there anteriorly and posteriorly and no axillary adenopathy  Abdominal: Soft. Bowel sounds are normal. He exhibits no mass. There is no tenderness. There is no rebound and no guarding.  No liver or spleen enlargement no masses no tenderness no inguinal adenopathy  Genitourinary: Rectum normal, prostate normal and penis normal.  Genitourinary Comments: Prostate is slightly enlarged without lumps or masses.  There were no rectal masses.  There were no inguinal hernias palpable.  The external genitalia were within normal limits.  Musculoskeletal: Normal range of motion. He exhibits no edema.  Lymphadenopathy:    He has no cervical adenopathy.  Neurological: He is alert and oriented to person, place, and time. He has normal reflexes. No cranial nerve deficit.  Skin: Skin is warm and dry. No rash noted.  Psychiatric: He has a normal mood and affect. His behavior is normal. Judgment and thought content normal.  Nursing note and vitals reviewed.   BP 114/69 (BP Location: Left Arm)   Pulse 73   Temp (!) 96.9 F (36.1 C) (Oral)   Ht '5\' 8"'$  (1.727 m)   Wt 209 lb (94.8 kg)   BMI 31.78 kg/m   EKG with results pending==     Assessment & Plan:  1. Annual physical exam -Next colonoscopy will be due in September 2020 - BMP8+EGFR - CBC with Differential/Platelet - Hepatic function panel - VITAMIN D 25 Hydroxy (Vit-D Deficiency, Fractures) - Lipid panel - PSA, total and free - Bayer DCA Hb A1c Waived - Urinalysis, Complete - EKG  12-Lead  2. Pure hypercholesterolemia -Continue with current treatment pending results of lab work - CBC with Differential/Platelet - Lipid panel - EKG 12-Lead  3. Essential hypertension -Blood pressure is good today and he will continue with current treatment - BMP8+EGFR - CBC with Differential/Platelet - Hepatic function panel - EKG 12-Lead  4. Metabolic syndrome -Continue with aggressive therapeutic lifestyle changes through exercise and diet and efforts to lose weight because of elevated BMI - CBC with Differential/Platelet  5. Vitamin D deficiency -10 you with current treatment pending results of lab work - CBC with Differential/Platelet - VITAMIN D 25 Hydroxy (Vit-D Deficiency, Fractures)  6. Type 2 diabetes mellitus with complication, without long-term current use of insulin (HCC) Continue with Actos and all efforts to lose weight - CBC with Differential/Platelet - Bayer DCA Hb A1c Waived  7. Elevated PSA -This is been going on for a good while being elevated off and on.  He has  had several visits to the urologist. - CBC with Differential/Platelet - PSA, total and free - Urinalysis, Complete  8. Right ear impacted cerumen -Ear irrigation to remove cerumen from this ear canal  Patient Instructions                       Medicare Annual Wellness Visit  [facility] and the medical providers at [facility] strive to bring you the best medical care.  In doing so we not only want to address your current medical conditions and concerns but also to detect new conditions early and prevent illness, disease and health-related problems.    Medicare offers a yearly Wellness Visit which allows our clinical staff to assess your need for preventative services including immunizations, lifestyle education, counseling to decrease risk of preventable diseases and screening for fall risk and other medical concerns.    This visit is provided free of charge (no  copay) for all Medicare recipients. The clinical pharmacists at [facility] have begun to conduct these Wellness Visits which will also include a thorough review of all your medications.    As you primary medical provider recommend that you make an appointment for your Annual Wellness Visit if you have not done so already this year.  You may set up this appointment before you leave today or you may call back ([phone]) and schedule an appointment.  Please make sure when you call that you mention that you are scheduling your Annual Wellness Visit with the clinical pharmacist so that the appointment may be made for the proper length of time.     Continue current medications. Continue good therapeutic lifestyle changes which include good diet and exercise. Fall precautions discussed with patient. If an FOBT was given today- please return it to our front desk. If you are over 35 years old - you may need Prevnar 5 or the adult Pneumonia vaccine.  **Flu shots are available--- please call and schedule a FLU-CLINIC appointment**  After your visit with [place] today you will receive a survey in the mail or online from [employer] regarding your care with [place]. Please take a moment to fill this out. Your feedback is very important to [place] as you can help [place] better understand your patient needs as well as improve your experience and satisfaction. WE CARE ABOUT YOU!!!   For your records, your next colonoscopy will be due in September 2020 You did have an exercise treadmill test in September 2017 by Dr. Orene Desanctis with [facility].  He should probably get another stress test in about 5 years from that time.  Sooner if any symptoms develop. The periodic use of Debrox eardrops 2-3 drops to the ear canal for 3 nights in a row once a month can help keep cerumen soft with easy removal.   Arrie Senate MD   .

## 2016-11-14 NOTE — Patient Instructions (Addendum)
Medicare Annual Wellness Visit  [facility] and the medical providers at [facility] strive to bring you the best medical care.  In doing so we not only want to address your current medical conditions and concerns but also to detect new conditions early and prevent illness, disease and health-related problems.    Medicare offers a yearly Wellness Visit which allows our clinical staff to assess your need for preventative services including immunizations, lifestyle education, counseling to decrease risk of preventable diseases and screening for fall risk and other medical concerns.    This visit is provided free of charge (no copay) for all Medicare recipients. The clinical pharmacists at [facility] have begun to conduct these Wellness Visits which will also include a thorough review of all your medications.    As you primary medical provider recommend that you make an appointment for your Annual Wellness Visit if you have not done so already this year.  You may set up this appointment before you leave today or you may call back ([phone]) and schedule an appointment.  Please make sure when you call that you mention that you are scheduling your Annual Wellness Visit with the clinical pharmacist so that the appointment may be made for the proper length of time.     Continue current medications. Continue good therapeutic lifestyle changes which include good diet and exercise. Fall precautions discussed with patient. If an FOBT was given today- please return it to our front desk. If you are over 68 years old - you may need Prevnar 86 or the adult Pneumonia vaccine.  **Flu shots are available--- please call and schedule a FLU-CLINIC appointment**  After your visit with [place] today you will receive a survey in the mail or online from [employer] regarding your care with [place]. Please take a moment to fill this out. Your feedback is very  important to [place] as you can help [place] better understand your patient needs as well as improve your experience and satisfaction. WE CARE ABOUT YOU!!!   For your records, your next colonoscopy will be due in September 2020 You did have an exercise treadmill test in September 2017 by Dr. Orene Desanctis with [facility].  He should probably get another stress test in about 5 years from that time.  Sooner if any symptoms develop. The periodic use of Debrox eardrops 2-3 drops to the ear canal for 3 nights in a row once a month can help keep cerumen soft with easy removal.

## 2016-11-14 NOTE — Addendum Note (Signed)
Addended by: Zannie Cove on: 11/14/2016 12:10 PM   Modules accepted: Orders

## 2016-11-14 NOTE — Addendum Note (Signed)
Addended by: Zannie Cove on: 11/14/2016 10:44 AM   Modules accepted: Orders

## 2016-11-15 LAB — CBC WITH DIFFERENTIAL/PLATELET
BASOS: 1 %
Basophils Absolute: 0 10*3/uL (ref 0.0–0.2)
EOS (ABSOLUTE): 0.1 10*3/uL (ref 0.0–0.4)
EOS: 2 %
HEMATOCRIT: 44.6 % (ref 37.5–51.0)
HEMOGLOBIN: 15.4 g/dL (ref 13.0–17.7)
Immature Grans (Abs): 0 10*3/uL (ref 0.0–0.1)
Immature Granulocytes: 1 %
LYMPHS ABS: 2 10*3/uL (ref 0.7–3.1)
Lymphs: 33 %
MCH: 33.9 pg — ABNORMAL HIGH (ref 26.6–33.0)
MCHC: 34.5 g/dL (ref 31.5–35.7)
MCV: 98 fL — ABNORMAL HIGH (ref 79–97)
MONOCYTES: 10 %
Monocytes Absolute: 0.6 10*3/uL (ref 0.1–0.9)
Neutrophils Absolute: 3.2 10*3/uL (ref 1.4–7.0)
Neutrophils: 53 %
Platelets: 314 10*3/uL (ref 150–379)
RBC: 4.54 x10E6/uL (ref 4.14–5.80)
RDW: 13.5 % (ref 12.3–15.4)
WBC: 5.9 10*3/uL (ref 3.4–10.8)

## 2016-11-15 LAB — BMP8+EGFR
BUN / CREAT RATIO: 16 (ref 10–24)
BUN: 19 mg/dL (ref 8–27)
CO2: 24 mmol/L (ref 20–29)
CREATININE: 1.16 mg/dL (ref 0.76–1.27)
Calcium: 8.9 mg/dL (ref 8.6–10.2)
Chloride: 102 mmol/L (ref 96–106)
GFR, EST AFRICAN AMERICAN: 71 mL/min/{1.73_m2} (ref >59)
GFR, EST NON AFRICAN AMERICAN: 61 mL/min/{1.73_m2} (ref >59)
Glucose: 98 mg/dL (ref 65–99)
Potassium: 4.5 mmol/L (ref 3.5–5.2)
Sodium: 141 mmol/L (ref 134–144)

## 2016-11-15 LAB — HEPATIC FUNCTION PANEL
ALBUMIN: 4.1 g/dL (ref 3.5–4.8)
ALK PHOS: 76 IU/L (ref 39–117)
ALT: 22 IU/L (ref 0–44)
AST: 27 IU/L (ref 0–40)
BILIRUBIN TOTAL: 1.1 mg/dL (ref 0.0–1.2)
Bilirubin, Direct: 0.26 mg/dL (ref 0.00–0.40)
Total Protein: 6.9 g/dL (ref 6.0–8.5)

## 2016-11-15 LAB — LIPID PANEL
CHOL/HDL RATIO: 3.6 ratio (ref 0.0–5.0)
CHOLESTEROL TOTAL: 157 mg/dL (ref 100–199)
HDL: 44 mg/dL (ref >39)
LDL CALC: 89 mg/dL (ref 0–99)
Triglycerides: 119 mg/dL (ref 0–149)
VLDL Cholesterol Cal: 24 mg/dL (ref 5–40)

## 2016-11-15 LAB — VITAMIN D 25 HYDROXY (VIT D DEFICIENCY, FRACTURES): Vit D, 25-Hydroxy: 46.2 ng/mL (ref 30.0–100.0)

## 2016-11-15 LAB — PSA, TOTAL AND FREE
PROSTATE SPECIFIC AG, SERUM: 3.2 ng/mL (ref 0.0–4.0)
PSA FREE: 0.67 ng/mL
PSA, Free Pct: 20.9 %

## 2016-11-16 LAB — URINE CULTURE

## 2016-11-18 ENCOUNTER — Telehealth: Payer: Self-pay | Admitting: Family Medicine

## 2016-11-18 MED ORDER — DOXYCYCLINE HYCLATE 100 MG PO TABS: 100.0000 mg | ORAL_TABLET | Freq: Two times a day (BID) | ORAL | 0 refills | Status: DC

## 2016-11-18 NOTE — Telephone Encounter (Signed)
Aware of meds  

## 2017-01-14 ENCOUNTER — Other Ambulatory Visit: Payer: Self-pay | Admitting: Family Medicine

## 2017-02-21 ENCOUNTER — Other Ambulatory Visit: Payer: Self-pay | Admitting: Family Medicine

## 2017-05-08 ENCOUNTER — Encounter: Payer: Self-pay | Admitting: *Deleted

## 2017-05-14 ENCOUNTER — Ambulatory Visit: Payer: Medicare Other | Admitting: Family Medicine

## 2017-05-15 ENCOUNTER — Ambulatory Visit: Payer: Medicare Other | Admitting: Family Medicine

## 2017-05-19 ENCOUNTER — Other Ambulatory Visit: Payer: Self-pay | Admitting: Family Medicine

## 2017-05-19 NOTE — Telephone Encounter (Signed)
OV 06/13/17

## 2017-05-20 ENCOUNTER — Other Ambulatory Visit: Payer: Self-pay | Admitting: Family Medicine

## 2017-05-30 ENCOUNTER — Ambulatory Visit: Payer: Medicare Other | Admitting: Family Medicine

## 2017-06-04 ENCOUNTER — Ambulatory Visit: Payer: Medicare Other | Admitting: Family Medicine

## 2017-06-13 ENCOUNTER — Other Ambulatory Visit: Payer: Self-pay | Admitting: *Deleted

## 2017-06-13 ENCOUNTER — Encounter: Payer: Self-pay | Admitting: Family Medicine

## 2017-06-13 ENCOUNTER — Ambulatory Visit: Payer: Medicare Other | Admitting: Family Medicine

## 2017-06-13 VITALS — BP 115/68 | HR 67 | Temp 96.8°F | Ht 68.0 in | Wt 214.0 lb

## 2017-06-13 DIAGNOSIS — H6011 Cellulitis of right external ear: Secondary | ICD-10-CM | POA: Diagnosis not present

## 2017-06-13 DIAGNOSIS — E8881 Metabolic syndrome: Secondary | ICD-10-CM | POA: Diagnosis not present

## 2017-06-13 DIAGNOSIS — E7800 Pure hypercholesterolemia, unspecified: Secondary | ICD-10-CM

## 2017-06-13 DIAGNOSIS — R972 Elevated prostate specific antigen [PSA]: Secondary | ICD-10-CM | POA: Diagnosis not present

## 2017-06-13 DIAGNOSIS — I10 Essential (primary) hypertension: Secondary | ICD-10-CM | POA: Diagnosis not present

## 2017-06-13 DIAGNOSIS — H60313 Diffuse otitis externa, bilateral: Secondary | ICD-10-CM | POA: Diagnosis not present

## 2017-06-13 DIAGNOSIS — Z6832 Body mass index (BMI) 32.0-32.9, adult: Secondary | ICD-10-CM | POA: Diagnosis not present

## 2017-06-13 DIAGNOSIS — E559 Vitamin D deficiency, unspecified: Secondary | ICD-10-CM | POA: Diagnosis not present

## 2017-06-13 MED ORDER — NEOMYCIN-POLYMYXIN-HC 3.5-10000-1 OT SOLN: 3.0000 [drp] | OTIC | 1 refills | Status: AC

## 2017-06-13 MED ORDER — CEPHALEXIN 500 MG PO CAPS: 500.0000 mg | ORAL_CAPSULE | Freq: Three times a day (TID) | ORAL | 0 refills | Status: DC

## 2017-06-13 MED ORDER — ATORVASTATIN CALCIUM 40 MG PO TABS: 80.0000 mg | ORAL_TABLET | Freq: Every day | ORAL | 3 refills | Status: DC

## 2017-06-13 MED ORDER — ATORVASTATIN CALCIUM 40 MG PO TABS: 40.0000 mg | ORAL_TABLET | Freq: Every day | ORAL | 3 refills | Status: AC

## 2017-06-13 NOTE — Progress Notes (Signed)
Subjective:    Patient ID: Mario Powers, male    DOB: 1940-05-03, 77 y.o.   MRN: [id number]  HPI Pt here for follow up and management of chronic medical problems which includes hypertension and hyperlipidemia. He is taking medication regularly.  Patient is doing well overall he is requesting a refill on his atorvastatin does complain of some draining from the right ear.  He was given an FOBT to return and will get lab work today.  His vital signs are stable except his weight is up by 5 pounds and his BMI is 31.79.  The patient takes Actos for metabolic syndrome.  The patient is pleasant and doing well and complains of problems with his right ear that comes and goes.  We have had to remove cerumen in the past.  He denies any chest pain or shortness of breath.  He denies any trouble with his stomach including nausea vomiting diarrhea blood in the stool black tarry bowel movements or change in bowel habits.  His last colonoscopy was in September 2015 and he was told because of polyps that he needed another colonoscopy in September 2020.  Dr. Fuller Plan did the last colonoscopy.  The patient is passing his water well with no burning pain or frequency and he has a long history of up-and-down PSA readings.  The urologist did not want to see him back anymore but we still tracked these readings when we see him.  The BMI was discussed with the patient and he was encouraged to focus on more activity and better diet habits.     Patient Active Problem List   Diagnosis Date Noted  . Puncture wound of lower leg, left 09/09/2014  . Metabolic syndrome 59/16/3846  . Elevated blood sugar 01/13/2013  . Personal history of colonic polyps 01/13/2013  . Hyperlipidemia 07/22/2012  . Hypertension 07/22/2012  . BPH (benign prostatic hypertrophy) 07/22/2012  . Vitamin D deficiency 07/22/2012   Outpatient Encounter Medications as of 06/13/2017  Medication Sig  . aspirin 81 MG tablet Take 81 mg by mouth daily.  Marland Kitchen  atorvastatin (LIPITOR) 80 MG tablet TAKE 1 TABLET (80 MG TOTAL) BY MOUTH DAILY.  Marland Kitchen Cholecalciferol (VITAMIN D) 2000 UNITS CAPS Take 1 capsule by mouth daily.  Marland Kitchen losartan-hydrochlorothiazide (HYZAAR) 100-12.5 MG tablet TAKE 1 TABLET BY MOUTH DAILY.  . Multiple Vitamin (MULTIVITAMIN) tablet Take 1 tablet by mouth daily.  . Omega-3 Fatty Acids (FISH OIL) 1200 MG CAPS Take 1,200 mg by mouth 2 (two) times daily.  . pioglitazone (ACTOS) 30 MG tablet TAKE 1 TABLET (30 MG TOTAL) BY MOUTH DAILY.  . [DISCONTINUED] doxycycline (VIBRA-TABS) 100 MG tablet Take 1 tablet (100 mg total) by mouth 2 (two) times daily. 1 po bid  . [DISCONTINUED] neomycin-polymyxin-hydrocortisone (CORTISPORIN) OTIC solution Place 3 drops into the right ear 4 (four) times daily. With cotton wick as directed   No facility-administered encounter medications on file as of 06/13/2017.      Review of Systems  Constitutional: Negative.   HENT: Negative.        Right ear draining  Eyes: Negative.   Respiratory: Negative.   Cardiovascular: Negative.   Gastrointestinal: Negative.   Endocrine: Negative.   Genitourinary: Negative.   Musculoskeletal: Negative.   Skin: Negative.   Allergic/Immunologic: Negative.   Neurological: Negative.   Hematological: Negative.   Psychiatric/Behavioral: Negative.        Objective:   Physical Exam  Constitutional: He is oriented to person, place, and time. He appears well-developed  and well-nourished. No distress.  Patient is pleasant and relaxed  HENT:  Head: Normocephalic and atraumatic.  Nose: Nose normal.  Mouth/Throat: Oropharynx is clear and moist. No oropharyngeal exudate.  Ear canal edema and swelling on the right side with some flaking and cotton type debris that was removed with a curette.  The left ear canal has a lot of skin and flaking in the eardrum appears normal.  Eyes: Pupils are equal, round, and reactive to light. Conjunctivae and EOM are normal. Right eye exhibits no  discharge. Left eye exhibits no discharge.  Neck: Normal range of motion. Neck supple. No thyromegaly present.  No bruits thyromegaly or anterior cervical adenopathy  Cardiovascular: Normal rate, regular rhythm, normal heart sounds and intact distal pulses.  No murmur heard. Heart is regular at 72/min  Pulmonary/Chest: Effort normal and breath sounds normal. He has no wheezes. He has no rales. He exhibits no tenderness.  Lungs are clear anteriorly and posteriorly and no axillary adenopathy  Abdominal: Soft. Bowel sounds are normal. He exhibits no mass. There is no tenderness. There is no guarding.  Abdominal obesity without organ enlargement masses bruits or inguinal adenopathy  Genitourinary:  Genitourinary Comments: The rectal exam will be done at the next visit and if the PSA is elevated he may have to have a rectal exam sooner and he understands this.  Musculoskeletal: Normal range of motion. He exhibits no edema.  Lymphadenopathy:    He has no cervical adenopathy.  Neurological: He is alert and oriented to person, place, and time. He has normal reflexes. No cranial nerve deficit.  Skin: Skin is warm and dry. Rash noted. There is erythema. No pallor.  Ear canal irritation especially right ear canal with cellulitis and edema.  Psychiatric: He has a normal mood and affect. His behavior is normal. Judgment and thought content normal.  Mood affect and behavior all normal  Nursing note and vitals reviewed.  BP 115/68 (BP Location: Left Arm)   Pulse 67   Temp (!) 96.8 F (36 C) (Oral)   Ht '5\' 8"'$  (1.727 m)   Wt 214 lb (97.1 kg)   BMI 32.54 kg/m         Assessment & Plan:  1. Metabolic syndrome -Continue to work on weight and BMI through diet and exercising.  Make a commitment to drink more water eat less carbs and bread and eat more fresh vegetables and fruits - BMP8+EGFR - CBC with Differential/Platelet  2. Essential hypertension -The blood pressure is good today and he will  continue with current treatment - CBC with Differential/Platelet - Hepatic function panel  3. Pure hypercholesterolemia -Continue with atorvastatin and with as aggressive therapeutic lifestyle changes as possible pending results of lab work - CBC with Differential/Platelet - Lipid panel  4.  Vitamin D deficiency - CBC with Differential/Platelet - VITAMIN D 25 Hydroxy (Vit-D Deficiency, Fractures)  5. BMI 32.0-32.9,adult -Continue with aggressive therapeutic lifestyle changes making all efforts to lose weight through diet and exercise.  Make a commitment and do this  6. Elevated PSA -As long as the PSA is stable we will do your next rectal exam at the next visit - PSA, total and free  7. Cellulitis of ear canal, right -Take Keflex and use antifungal eardrops as discussed and keep water out of the ear  8. Diffuse otitis externa of both ears, unspecified chronicity -Use antifungal eardrops with cotton wick as directed for 7 to 10 days and following that could use small amounts of  cortisone 10 to the external ear canal opening 2-3 nights weekly. The patient was reminded if the problem continues he may have to see an ear nose and throat specialist at [facility].  Meds ordered this encounter  Medications  . atorvastatin (LIPITOR) 40 MG tablet    Sig: Take 2 tablets (80 mg total) by mouth daily.    Dispense:  90 tablet    Refill:  3   Patient Instructions                       Medicare Annual Wellness Visit  [facility] and the medical providers at [facility] strive to bring you the best medical care.  In doing so we not only want to address your current medical conditions and concerns but also to detect new conditions early and prevent illness, disease and health-related problems.    Medicare offers a yearly Wellness Visit which allows our clinical staff to assess your need for preventative services including immunizations, lifestyle education, counseling to  decrease risk of preventable diseases and screening for fall risk and other medical concerns.    This visit is provided free of charge (no copay) for all Medicare recipients. The clinical pharmacists at [facility] have begun to conduct these Wellness Visits which will also include a thorough review of all your medications.    As you primary medical provider recommend that you make an appointment for your Annual Wellness Visit if you have not done so already this year.  You may set up this appointment before you leave today or you may call back ([phone]) and schedule an appointment.  Please make sure when you call that you mention that you are scheduling your Annual Wellness Visit with the clinical pharmacist so that the appointment may be made for the proper length of time.     Continue current medications. Continue good therapeutic lifestyle changes which include good diet and exercise. Fall precautions discussed with patient. If an FOBT was given today- please return it to our front desk. If you are over 11 years old - you may need Prevnar 43 or the adult Pneumonia vaccine.  **Flu shots are available--- please call and schedule a FLU-CLINIC appointment**  After your visit with [place] today you will receive a survey in the mail or online from [employer] regarding your care with [place]. Please take a moment to fill this out. Your feedback is very important to [place] as you can help [place] better understand your patient needs as well as improve your experience and satisfaction. WE CARE ABOUT YOU!!!   Continue to work aggressively on weight loss and body mass index by losing weight through diet and exercise Drink plenty of water and stay well-hydrated You will need to get your next colonoscopy in September 2020.  Dr. Fuller Plan with [facility] did the previous colonoscopy.  Please remember this date and time. You will be given an FOBT to return today. Try to keep the water out of both ears as  water increases the risk of infection and irritation Use eardrops in both ears for 7 to 10 days using a cotton wick as directed put a new weekend that is saturated with medication after each bath or shower and for 3 more times during that day put 3 or 4 drops of medicine onto the wick  After 7 to 10 days continue to keep the water out of the ear and 3 times weekly use cortisone  10 a small amount on a Q-tip just in the external area of the ear just inside the ear canal. If problems continue with the right ear especially you may need to see an ear nose and throat specialist for fungal cultures and better treatment. We will call with your lab work results as soon as these results become available    Arrie Senate MD

## 2017-06-13 NOTE — Addendum Note (Signed)
Addended by: Zannie Cove on: 06/13/2017 12:02 PM   Modules accepted: Orders

## 2017-06-13 NOTE — Patient Instructions (Addendum)
Medicare Annual Wellness Visit  [facility] and the medical providers at [facility] strive to bring you the best medical care.  In doing so we not only want to address your current medical conditions and concerns but also to detect new conditions early and prevent illness, disease and health-related problems.    Medicare offers a yearly Wellness Visit which allows our clinical staff to assess your need for preventative services including immunizations, lifestyle education, counseling to decrease risk of preventable diseases and screening for fall risk and other medical concerns.    This visit is provided free of charge (no copay) for all Medicare recipients. The clinical pharmacists at [facility] have begun to conduct these Wellness Visits which will also include a thorough review of all your medications.    As you primary medical provider recommend that you make an appointment for your Annual Wellness Visit if you have not done so already this year.  You may set up this appointment before you leave today or you may call back ([phone]) and schedule an appointment.  Please make sure when you call that you mention that you are scheduling your Annual Wellness Visit with the clinical pharmacist so that the appointment may be made for the proper length of time.     Continue current medications. Continue good therapeutic lifestyle changes which include good diet and exercise. Fall precautions discussed with patient. If an FOBT was given today- please return it to our front desk. If you are over 3 years old - you may need Prevnar 20 or the adult Pneumonia vaccine.  **Flu shots are available--- please call and schedule a FLU-CLINIC appointment**  After your visit with [place] today you will receive a survey in the mail or online from [employer] regarding your care with [place]. Please take a moment to fill this out. Your feedback is very  important to [place] as you can help [place] better understand your patient needs as well as improve your experience and satisfaction. WE CARE ABOUT YOU!!!   Continue to work aggressively on weight loss and body mass index by losing weight through diet and exercise Drink plenty of water and stay well-hydrated You will need to get your next colonoscopy in September 2020.  Dr. Fuller Plan with [facility] did the previous colonoscopy.  Please remember this date and time. You will be given an FOBT to return today. Try to keep the water out of both ears as water increases the risk of infection and irritation Use eardrops in both ears for 7 to 10 days using a cotton wick as directed put a new weekend that is saturated with medication after each bath or shower and for 3 more times during that day put 3 or 4 drops of medicine onto the wick  After 7 to 10 days continue to keep the water out of the ear and 3 times weekly use cortisone 10 a small amount on a Q-tip just in the external area of the ear just inside the ear canal. If problems continue with the right ear especially you may need to see an ear nose and throat specialist for fungal cultures and better treatment. We will call with your lab work results as soon as these results become available Use soaps fabric softeners and detergents that are scent free

## 2017-06-13 NOTE — Telephone Encounter (Signed)
Script written for atorvastatin 40 mg 2 tabs daily Confirmed with Dr. Laurance Flatten resent script as 1 tab daily

## 2017-06-14 LAB — CBC WITH DIFFERENTIAL/PLATELET
BASOS: 1 %
Basophils Absolute: 0 10*3/uL (ref 0.0–0.2)
EOS (ABSOLUTE): 0.1 10*3/uL (ref 0.0–0.4)
Eos: 2 %
HEMATOCRIT: 45.8 % (ref 37.5–51.0)
Hemoglobin: 14.7 g/dL (ref 13.0–17.7)
IMMATURE GRANS (ABS): 0 10*3/uL (ref 0.0–0.1)
Immature Granulocytes: 0 %
LYMPHS: 38 %
Lymphocytes Absolute: 2.3 10*3/uL (ref 0.7–3.1)
MCH: 32.7 pg (ref 26.6–33.0)
MCHC: 32.1 g/dL (ref 31.5–35.7)
MCV: 102 fL — AB (ref 79–97)
Monocytes Absolute: 0.5 10*3/uL (ref 0.1–0.9)
Monocytes: 8 %
NEUTROS ABS: 3.2 10*3/uL (ref 1.4–7.0)
Neutrophils: 51 %
Platelets: 307 10*3/uL (ref 150–379)
RBC: 4.5 x10E6/uL (ref 4.14–5.80)
RDW: 13.5 % (ref 12.3–15.4)
WBC: 6.1 10*3/uL (ref 3.4–10.8)

## 2017-06-14 LAB — BMP8+EGFR
BUN/Creatinine Ratio: 16 (ref 10–24)
BUN: 18 mg/dL (ref 8–27)
CALCIUM: 8.9 mg/dL (ref 8.6–10.2)
CO2: 23 mmol/L (ref 20–29)
Chloride: 102 mmol/L (ref 96–106)
Creatinine, Ser: 1.13 mg/dL (ref 0.76–1.27)
GFR, EST AFRICAN AMERICAN: 73 mL/min/{1.73_m2} (ref >59)
GFR, EST NON AFRICAN AMERICAN: 63 mL/min/{1.73_m2} (ref >59)
Glucose: 95 mg/dL (ref 65–99)
Potassium: 4.6 mmol/L (ref 3.5–5.2)
Sodium: 140 mmol/L (ref 134–144)

## 2017-06-14 LAB — HEPATIC FUNCTION PANEL
ALBUMIN: 4.1 g/dL (ref 3.5–4.8)
ALT: 22 IU/L (ref 0–44)
AST: 26 IU/L (ref 0–40)
Alkaline Phosphatase: 81 IU/L (ref 39–117)
BILIRUBIN TOTAL: 0.9 mg/dL (ref 0.0–1.2)
BILIRUBIN, DIRECT: 0.22 mg/dL (ref 0.00–0.40)
TOTAL PROTEIN: 7 g/dL (ref 6.0–8.5)

## 2017-06-14 LAB — LIPID PANEL
Chol/HDL Ratio: 3.3 ratio (ref 0.0–5.0)
Cholesterol, Total: 140 mg/dL (ref 100–199)
HDL: 43 mg/dL (ref >39)
LDL CALC: 75 mg/dL (ref 0–99)
Triglycerides: 111 mg/dL (ref 0–149)
VLDL CHOLESTEROL CAL: 22 mg/dL (ref 5–40)

## 2017-06-14 LAB — PSA, TOTAL AND FREE
PSA FREE: 0.63 ng/mL
PSA, Free Pct: 20.3 %
Prostate Specific Ag, Serum: 3.1 ng/mL (ref 0.0–4.0)

## 2017-06-14 LAB — VITAMIN D 25 HYDROXY (VIT D DEFICIENCY, FRACTURES): Vit D, 25-Hydroxy: 50.2 ng/mL (ref 30.0–100.0)

## 2017-08-16 ENCOUNTER — Other Ambulatory Visit: Payer: Self-pay | Admitting: Family Medicine

## 2017-08-31 ENCOUNTER — Other Ambulatory Visit: Payer: Self-pay | Admitting: Family Medicine

## 2017-09-01 ENCOUNTER — Other Ambulatory Visit: Payer: Self-pay | Admitting: Family Medicine

## 2017-09-01 MED ORDER — PIOGLITAZONE HCL 30 MG PO TABS: 30.0000 mg | ORAL_TABLET | Freq: Every day | ORAL | 0 refills | Status: DC

## 2017-09-05 MED ORDER — PIOGLITAZONE HCL 30 MG PO TABS: 30.0000 mg | ORAL_TABLET | Freq: Every day | ORAL | 0 refills | Status: DC

## 2017-09-05 NOTE — Addendum Note (Signed)
Addended by: Antonietta Barcelona D on: 09/05/2017 08:23 AM   Modules accepted: Orders

## 2017-09-05 NOTE — Telephone Encounter (Signed)
09/01/17 refill failed, resent

## 2017-10-30 ENCOUNTER — Telehealth: Payer: Self-pay | Admitting: *Deleted

## 2017-10-30 NOTE — Telephone Encounter (Signed)
VM from [facility] to see if pt is compliant in taking his Losartan-HCTZ or if he is still on the medication. TC to patient, he is still on the medication. He splits the medication in half. He had taken a whole tablet awhile back which dropped his BP too low. Dr. Laurance Flatten agreed with 1/2 tab daily since there is not a lower dose than what he is currently on. Pt has had this noted a couple of times with [facility] and his pharmacy. I also returned call to [facility] to have it noted as well

## 2017-12-15 ENCOUNTER — Encounter: Payer: Self-pay | Admitting: Family Medicine

## 2017-12-15 ENCOUNTER — Ambulatory Visit (INDEPENDENT_AMBULATORY_CARE_PROVIDER_SITE_OTHER): Payer: Medicare Other

## 2017-12-15 ENCOUNTER — Ambulatory Visit: Payer: Medicare Other | Admitting: Family Medicine

## 2017-12-15 VITALS — BP 124/70 | HR 76 | Temp 97.0°F | Ht 68.0 in | Wt 215.0 lb

## 2017-12-15 DIAGNOSIS — Z0001 Encounter for general adult medical examination with abnormal findings: Secondary | ICD-10-CM

## 2017-12-15 DIAGNOSIS — E7800 Pure hypercholesterolemia, unspecified: Secondary | ICD-10-CM

## 2017-12-15 DIAGNOSIS — N400 Enlarged prostate without lower urinary tract symptoms: Secondary | ICD-10-CM

## 2017-12-15 DIAGNOSIS — E8881 Metabolic syndrome: Secondary | ICD-10-CM

## 2017-12-15 DIAGNOSIS — M25511 Pain in right shoulder: Secondary | ICD-10-CM | POA: Diagnosis not present

## 2017-12-15 DIAGNOSIS — E559 Vitamin D deficiency, unspecified: Secondary | ICD-10-CM

## 2017-12-15 DIAGNOSIS — I10 Essential (primary) hypertension: Secondary | ICD-10-CM

## 2017-12-15 DIAGNOSIS — Z6832 Body mass index (BMI) 32.0-32.9, adult: Secondary | ICD-10-CM

## 2017-12-15 DIAGNOSIS — R972 Elevated prostate specific antigen [PSA]: Secondary | ICD-10-CM

## 2017-12-15 DIAGNOSIS — Z0000 Encounter for general adult medical examination without abnormal findings: Secondary | ICD-10-CM

## 2017-12-15 DIAGNOSIS — I700 Atherosclerosis of aorta: Secondary | ICD-10-CM | POA: Insufficient documentation

## 2017-12-15 DIAGNOSIS — L989 Disorder of the skin and subcutaneous tissue, unspecified: Secondary | ICD-10-CM

## 2017-12-15 LAB — BAYER DCA HB A1C WAIVED: HB A1C (BAYER DCA - WAIVED): 5.5 % (ref <7.0)

## 2017-12-15 MED ORDER — METHYLPREDNISOLONE ACETATE 80 MG/ML IJ SUSP
60.0000 mg | Freq: Once | INTRAMUSCULAR | Status: AC
  Administered 2017-12-15: 60 mg via INTRAMUSCULAR

## 2017-12-15 NOTE — Patient Instructions (Addendum)
Medicare Annual Wellness Visit  [facility] and the medical providers at [facility] strive to bring you the best medical care.  In doing so we not only want to address your current medical conditions and concerns but also to detect new conditions early and prevent illness, disease and health-related problems.    Medicare offers a yearly Wellness Visit which allows our clinical staff to assess your need for preventative services including immunizations, lifestyle education, counseling to decrease risk of preventable diseases and screening for fall risk and other medical concerns.    This visit is provided free of charge (no copay) for all Medicare recipients. The clinical pharmacists at [facility] have begun to conduct these Wellness Visits which will also include a thorough review of all your medications.    As you primary medical provider recommend that you make an appointment for your Annual Wellness Visit if you have not done so already this year.  You may set up this appointment before you leave today or you may call back [phone]) and schedule an appointment.  Please make sure when you call that you mention that you are scheduling your Annual Wellness Visit with the clinical pharmacist so that the appointment may be made for the proper length of time.     Continue current medications. Continue good therapeutic lifestyle changes which include good diet and exercise. Fall precautions discussed with patient. If an FOBT was given today- please return it to our front desk. If you are over 10 years old - you may need Prevnar 57 or the adult Pneumonia vaccine.  **Flu shots are available--- please call and schedule a FLU-CLINIC appointment**  After your visit with [place] today you will receive a survey in the mail or online from [employer] regarding your care with [place]. Please take a moment to fill this out. Your feedback is very  important to [place] as you can help [place] better understand your patient needs as well as improve your experience and satisfaction. WE CARE ABOUT YOU!!!   Your next colonoscopy will be due in September 2020.  The last gastroenterologist was Dr. Fuller Plan. You may take Pepcid AC if needed for occasional heartburn.  This is over-the-counter. Remember that caffeine fried greasy and highly spice foods may cause increased heartburn and reflux. If you know that you are going to be eating foods that may be bothering her stomach taking a Pepcid AC prior to eating those foods would be helpful. Always continue to drink plenty of fluids and stay well-hydrated and follow as aggressive therapeutic lifestyle changes as possible to achieve weight loss. Be sure and showed the picture to your dermatologist regarding the skin lesion on the back Continue to drink plenty of fluids and stay well-hydrated Make every effort to lose weight through diet and exercise

## 2017-12-15 NOTE — Addendum Note (Signed)
Addended by: Zannie Cove on: 12/15/2017 12:18 PM   Modules accepted: Orders

## 2017-12-15 NOTE — Progress Notes (Signed)
Subjective:    Patient ID: Mario Powers, male    DOB: 06-22-1940, 77 y.o.   MRN: [id number]  HPI  Patient is here today for annual wellness exam and follow up of chronic medical problems which includes hypertension and hyperlipidemia. He is taking medication regularly.  Patient is doing well today with no specific complaints other than right shoulder pain.  This patient has a history of periodic elevations of his PSA has been followed by the urologist and the practice for years for this.  He is due today to get routine lab work chest x-ray will be given an FOBT to return.  The family history is positive for hypertension aneurysm and dementia etc.  He has no siblings.  He does have a son and daughter.  He is currently taking a baby aspirin atorvastatin vitamin D3 2000 losartan HCTZ 100-12 0.5 multivitamin and omega-3 fatty acids along with pioglitazone 30 mg.  The patient is pleasant and alert.  He did reinjure his shoulder recently when he picked up some flooring and carry it up some steps.  He has had problems with his right shoulder for years off and on.  It usually gets better.  Today he denies any chest pain pressure shortness of breath.  He denies any trouble with swallowing other than some occasional heartburn and takes Tums for this periodically.  He does knows that caffeine bothers this.  He denies any blood in the stool black tarry bowel movements or change in bowel habits.  He is passing his water well.  He is due to get another eye exam in January.      Patient Active Problem List   Diagnosis Date Noted  . Puncture wound of lower leg, left 09/09/2014  . Metabolic syndrome 56/31/4970  . Elevated blood sugar 01/13/2013  . Personal history of colonic polyps 01/13/2013  . Hyperlipidemia 07/22/2012  . Hypertension 07/22/2012  . BPH (benign prostatic hypertrophy) 07/22/2012  . Vitamin D deficiency 07/22/2012   Outpatient Encounter Medications as of 12/15/2017  Medication Sig  .  aspirin 81 MG tablet Take 81 mg by mouth daily.  Marland Kitchen atorvastatin (LIPITOR) 40 MG tablet Take 1 tablet (40 mg total) by mouth daily.  . Cholecalciferol (VITAMIN D) 2000 UNITS CAPS Take 1 capsule by mouth daily.  Marland Kitchen losartan-hydrochlorothiazide (HYZAAR) 100-12.5 MG tablet Take 0.5 tablets by mouth daily.  . Multiple Vitamin (MULTIVITAMIN) tablet Take 1 tablet by mouth daily.  Marland Kitchen neomycin-polymyxin-hydrocortisone (CORTISPORIN) OTIC solution Place 3 drops into both ears every 4 (four) hours.  . Omega-3 Fatty Acids (FISH OIL) 1200 MG CAPS Take 1,200 mg by mouth 2 (two) times daily.  . pioglitazone (ACTOS) 30 MG tablet Take 1 tablet (30 mg total) by mouth daily.  . [DISCONTINUED] cephALEXin (KEFLEX) 500 MG capsule Take 1 capsule (500 mg total) by mouth 3 (three) times daily.  . [DISCONTINUED] losartan-hydrochlorothiazide (HYZAAR) 100-12.5 MG tablet TAKE 1 TABLET BY MOUTH DAILY.   No facility-administered encounter medications on file as of 12/15/2017.      Review of Systems  Constitutional: Negative.   HENT: Negative.   Eyes: Negative.   Respiratory: Negative.   Cardiovascular: Negative.   Gastrointestinal: Negative.   Endocrine: Negative.   Genitourinary: Negative.   Musculoskeletal: Positive for arthralgias (right shoulder pain ).  Skin: Negative.   Allergic/Immunologic: Negative.   Neurological: Negative.   Hematological: Negative.   Psychiatric/Behavioral: Negative.        Objective:   Physical Exam  Constitutional: He is oriented  to person, place, and time. He appears well-developed and well-nourished. No distress.  The patient is pleasant and alert and feeling well overall.  HENT:  Head: Normocephalic and atraumatic.  Right Ear: External ear normal.  Left Ear: External ear normal.  Nose: Nose normal.  Mouth/Throat: Oropharynx is clear and moist. No oropharyngeal exudate.  Ear canals are clear and no cerumen is present TMs look normal.  Eyes: Pupils are equal, round, and  reactive to light. Conjunctivae and EOM are normal. Right eye exhibits no discharge. Left eye exhibits no discharge. No scleral icterus.  Patient gets eyes examined regularly  Neck: Normal range of motion. Neck supple. No thyromegaly present.  No bruits thyromegaly or anterior cervical adenopathy  Cardiovascular: Normal rate, regular rhythm, normal heart sounds and intact distal pulses.  No murmur heard. Heart is regular at 72/min with good pedal pulses bilaterally  Pulmonary/Chest: Effort normal and breath sounds normal. He has no wheezes. He has no rales. He exhibits no tenderness.  Clear anteriorly and posteriorly and no axillary adenopathy chest wall masses.  Abdominal: Soft. Bowel sounds are normal. He exhibits no mass. There is no tenderness.  Slight epigastric tenderness without liver or spleen enlargement.  No masses no bruits no inguinal adenopathy  Genitourinary: Rectum normal and penis normal.  Genitourinary Comments: The prostate gland is slightly enlarged without lumps or masses.  There was no rectal masses.  The external genitalia were within normal limits with no inguinal hernias being palpated and no inguinal adenopathy noted.  Musculoskeletal: Normal range of motion. He exhibits tenderness. He exhibits no edema.  There is minimal tenderness in the right deltoid muscle, and the body of the muscle.  It especially is apparent with pushing himself up from a sitting position.  Range of motion with raising his arm over his head.  Lymphadenopathy:    He has no cervical adenopathy.  Neurological: He is alert and oriented to person, place, and time. He has normal reflexes. No cranial nerve deficit.  Reflexes are 2+ and equal bilaterally  Skin: Skin is warm and dry. No rash noted.  Small area to the left of the lower thoracic spine that could be a basal cell carcinoma.  Use patient's phone to make picture of this and he will show the dermatologist at his next visit.  Psychiatric: He has a  normal mood and affect. His behavior is normal. Judgment and thought content normal.  The patient's mood affect and behavior are normal for him.  Nursing note and vitals reviewed.  BP 124/70 (BP Location: Left Arm)   Pulse 76   Temp (!) 97 F (36.1 C) (Oral)   Ht '5\' 8"'$  (1.727 m)   Wt 215 lb (97.5 kg)   BMI 32.69 kg/m         Assessment & Plan:  1. Essential hypertension -Blood pressure is good today and patient will continue with current treatment and watching sodium intake - BMP8+EGFR - CBC with Differential/Platelet - Hepatic function panel - DG Chest 2 View; Future  2. Pure hypercholesterolemia -Continue with atorvastatin and as aggressive therapeutic lifestyle changes as possible including diet and exercise to lose weight - CBC with Differential/Platelet - Lipid panel - DG Chest 2 View; Future  3. Vitamin D deficiency -Continue with vitamin D replacement pending results of lab work - CBC with Differential/Platelet - VITAMIN D 25 Hydroxy (Vit-D Deficiency, Fractures)  4. Metabolic syndrome -Continue to lose weight through diet and exercise - CBC with Differential/Platelet - Bayer DCA Hb  A1c Waived  5. Elevated PSA -Check PSA today - CBC with Differential/Platelet - PSA, total and free - Urinalysis, Complete  6. Annual physical exam -Do not forget to get a colonoscopy in September 2020 by Dr. Fuller Plan - CBC with Differential/Platelet - Urinalysis, Complete  7. Right shoulder pain, unspecified chronicity -60 of Depo-Medrol to right deltoid muscle  8. BMI 32.0-32.9,adult -Continue to work on weight through diet and exercise  9. Benign prostatic hyperplasia without lower urinary tract symptoms -Continue drinking plenty of fluids and stay well-hydrated  10. Skin lesion -See dermatologist as planned  Patient Instructions                       Medicare Annual Wellness Visit  [facility] and the medical providers at [facility] strive  to bring you the best medical care.  In doing so we not only want to address your current medical conditions and concerns but also to detect new conditions early and prevent illness, disease and health-related problems.    Medicare offers a yearly Wellness Visit which allows our clinical staff to assess your need for preventative services including immunizations, lifestyle education, counseling to decrease risk of preventable diseases and screening for fall risk and other medical concerns.    This visit is provided free of charge (no copay) for all Medicare recipients. The clinical pharmacists at [facility] have begun to conduct these Wellness Visits which will also include a thorough review of all your medications.    As you primary medical provider recommend that you make an appointment for your Annual Wellness Visit if you have not done so already this year.  You may set up this appointment before you leave today or you may call back ([phone]) and schedule an appointment.  Please make sure when you call that you mention that you are scheduling your Annual Wellness Visit with the clinical pharmacist so that the appointment may be made for the proper length of time.     Continue current medications. Continue good therapeutic lifestyle changes which include good diet and exercise. Fall precautions discussed with patient. If an FOBT was given today- please return it to our front desk. If you are over 72 years old - you may need Prevnar 45 or the adult Pneumonia vaccine.  **Flu shots are available--- please call and schedule a FLU-CLINIC appointment**  After your visit with [place] today you will receive a survey in the mail or online from [employer] regarding your care with [place]. Please take a moment to fill this out. Your feedback is very important to [place] as you can help [place] better understand your patient needs as well as improve your experience and satisfaction. WE CARE ABOUT  YOU!!!   Your next colonoscopy will be due in September 2020.  The last gastroenterologist was Dr. Fuller Plan. You may take Pepcid AC if needed for occasional heartburn.  This is over-the-counter. Remember that caffeine fried greasy and highly spice foods may cause increased heartburn and reflux. If you know that you are going to be eating foods that may be bothering her stomach taking a Pepcid AC prior to eating those foods would be helpful. Always continue to drink plenty of fluids and stay well-hydrated and follow as aggressive therapeutic lifestyle changes as possible to achieve weight loss. Be sure and showed the picture to your dermatologist regarding the skin lesion on the back Continue to drink plenty of fluids and stay well-hydrated Make  every effort to lose weight through diet and exercise  Arrie Senate MD

## 2017-12-16 LAB — HEPATIC FUNCTION PANEL
ALK PHOS: 83 IU/L (ref 39–117)
ALT: 22 IU/L (ref 0–44)
AST: 26 IU/L (ref 0–40)
Albumin: 4 g/dL (ref 3.5–4.8)
BILIRUBIN, DIRECT: 0.28 mg/dL (ref 0.00–0.40)
Bilirubin Total: 1.2 mg/dL (ref 0.0–1.2)
TOTAL PROTEIN: 6.5 g/dL (ref 6.0–8.5)

## 2017-12-16 LAB — CBC WITH DIFFERENTIAL/PLATELET
BASOS: 1 %
Basophils Absolute: 0.1 10*3/uL (ref 0.0–0.2)
EOS (ABSOLUTE): 0.1 10*3/uL (ref 0.0–0.4)
EOS: 2 %
HEMATOCRIT: 42.1 % (ref 37.5–51.0)
Hemoglobin: 14.6 g/dL (ref 13.0–17.7)
IMMATURE GRANS (ABS): 0 10*3/uL (ref 0.0–0.1)
IMMATURE GRANULOCYTES: 1 %
Lymphocytes Absolute: 2.3 10*3/uL (ref 0.7–3.1)
Lymphs: 34 %
MCH: 34.4 pg — ABNORMAL HIGH (ref 26.6–33.0)
MCHC: 34.7 g/dL (ref 31.5–35.7)
MCV: 99 fL — AB (ref 79–97)
MONOS ABS: 0.7 10*3/uL (ref 0.1–0.9)
Monocytes: 10 %
NEUTROS PCT: 52 %
Neutrophils Absolute: 3.4 10*3/uL (ref 1.4–7.0)
PLATELETS: 283 10*3/uL (ref 150–450)
RBC: 4.24 x10E6/uL (ref 4.14–5.80)
RDW: 12.6 % (ref 12.3–15.4)
WBC: 6.6 10*3/uL (ref 3.4–10.8)

## 2017-12-16 LAB — BMP8+EGFR
BUN/Creatinine Ratio: 14 (ref 10–24)
BUN: 17 mg/dL (ref 8–27)
CO2: 23 mmol/L (ref 20–29)
CREATININE: 1.18 mg/dL (ref 0.76–1.27)
Calcium: 8.9 mg/dL (ref 8.6–10.2)
Chloride: 101 mmol/L (ref 96–106)
GFR calc Af Amer: 69 mL/min/{1.73_m2} (ref >59)
GFR, EST NON AFRICAN AMERICAN: 60 mL/min/{1.73_m2} (ref >59)
GLUCOSE: 92 mg/dL (ref 65–99)
Potassium: 4.1 mmol/L (ref 3.5–5.2)
Sodium: 138 mmol/L (ref 134–144)

## 2017-12-16 LAB — LIPID PANEL
CHOL/HDL RATIO: 3.3 ratio (ref 0.0–5.0)
Cholesterol, Total: 147 mg/dL (ref 100–199)
HDL: 44 mg/dL (ref >39)
LDL Calculated: 80 mg/dL (ref 0–99)
TRIGLYCERIDES: 113 mg/dL (ref 0–149)
VLDL CHOLESTEROL CAL: 23 mg/dL (ref 5–40)

## 2017-12-16 LAB — PSA, TOTAL AND FREE
PSA FREE PCT: 20.7 %
PSA, Free: 0.62 ng/mL
Prostate Specific Ag, Serum: 3 ng/mL (ref 0.0–4.0)

## 2017-12-16 LAB — VITAMIN D 25 HYDROXY (VIT D DEFICIENCY, FRACTURES): VIT D 25 HYDROXY: 48.1 ng/mL (ref 30.0–100.0)

## 2017-12-17 ENCOUNTER — Other Ambulatory Visit: Payer: Medicare Other

## 2017-12-17 DIAGNOSIS — Z1211 Encounter for screening for malignant neoplasm of colon: Secondary | ICD-10-CM

## 2017-12-17 DIAGNOSIS — R195 Other fecal abnormalities: Secondary | ICD-10-CM

## 2017-12-18 LAB — FECAL OCCULT BLOOD, IMMUNOCHEMICAL: FECAL OCCULT BLD: POSITIVE — AB

## 2017-12-19 NOTE — Addendum Note (Signed)
Addended by: Nigel Berthold C on: 12/19/2017 10:15 AM   Modules accepted: Orders

## 2018-01-15 ENCOUNTER — Encounter: Payer: Self-pay | Admitting: Family Medicine

## 2018-01-16 ENCOUNTER — Other Ambulatory Visit: Payer: Medicare Other

## 2018-01-16 DIAGNOSIS — R195 Other fecal abnormalities: Secondary | ICD-10-CM

## 2018-01-16 DIAGNOSIS — Z1211 Encounter for screening for malignant neoplasm of colon: Secondary | ICD-10-CM

## 2018-01-16 DIAGNOSIS — R319 Hematuria, unspecified: Secondary | ICD-10-CM

## 2018-01-16 LAB — MICROSCOPIC EXAMINATION
Bacteria, UA: NONE SEEN
EPITHELIAL CELLS (NON RENAL): NONE SEEN /HPF (ref 0–10)
RENAL EPITHEL UA: NONE SEEN /HPF
WBC, UA: >30 /hpf — AB (ref 0–5)

## 2018-01-16 LAB — URINALYSIS, COMPLETE
Bilirubin, UA: NEGATIVE
Glucose, UA: NEGATIVE
Ketones, UA: NEGATIVE
Nitrite, UA: NEGATIVE
PH UA: 6 (ref 5.0–7.5)
Protein, UA: NEGATIVE
Specific Gravity, UA: 1.015 (ref 1.005–1.030)
Urobilinogen, Ur: 0.2 mg/dL (ref 0.2–1.0)

## 2018-01-17 LAB — CBC WITH DIFFERENTIAL/PLATELET
Basophils Absolute: 0.1 10*3/uL (ref 0.0–0.2)
Basos: 1 %
EOS (ABSOLUTE): 0.1 10*3/uL (ref 0.0–0.4)
EOS: 1 %
HEMATOCRIT: 41.7 % (ref 37.5–51.0)
HEMOGLOBIN: 13.9 g/dL (ref 13.0–17.7)
IMMATURE GRANULOCYTES: 1 %
Immature Grans (Abs): 0 10*3/uL (ref 0.0–0.1)
Lymphocytes Absolute: 3 10*3/uL (ref 0.7–3.1)
Lymphs: 38 %
MCH: 33.3 pg — ABNORMAL HIGH (ref 26.6–33.0)
MCHC: 33.3 g/dL (ref 31.5–35.7)
MCV: 100 fL — AB (ref 79–97)
MONOCYTES: 12 %
MONOS ABS: 0.9 10*3/uL (ref 0.1–0.9)
NEUTROS PCT: 47 %
Neutrophils Absolute: 3.8 10*3/uL (ref 1.4–7.0)
Platelets: 336 10*3/uL (ref 150–450)
RBC: 4.17 x10E6/uL (ref 4.14–5.80)
RDW: 12.4 % (ref 12.3–15.4)
WBC: 7.9 10*3/uL (ref 3.4–10.8)

## 2018-01-17 LAB — FECAL OCCULT BLOOD, IMMUNOCHEMICAL: Fecal Occult Bld: NEGATIVE

## 2018-01-22 LAB — URINE CULTURE

## 2018-01-23 ENCOUNTER — Other Ambulatory Visit: Payer: Self-pay

## 2018-01-23 MED ORDER — SULFAMETHOXAZOLE-TRIMETHOPRIM 800-160 MG PO TABS: 1.0000 | ORAL_TABLET | Freq: Two times a day (BID) | ORAL | 0 refills | Status: AC

## 2018-02-28 ENCOUNTER — Other Ambulatory Visit: Payer: Self-pay | Admitting: Family Medicine

## 2018-03-02 MED ORDER — PIOGLITAZONE HCL 30 MG PO TABS: 30.0000 mg | ORAL_TABLET | Freq: Every day | ORAL | 0 refills | Status: AC

## 2018-06-10 ENCOUNTER — Other Ambulatory Visit: Payer: Self-pay | Admitting: Family Medicine

## 2018-06-17 ENCOUNTER — Ambulatory Visit: Payer: Medicare Other | Admitting: Family Medicine

## 2018-09-07 ENCOUNTER — Encounter: Payer: Self-pay | Admitting: Gastroenterology

## 2020-03-06 IMAGING — DX DG CHEST 2V
2 series · 2 of 2 positions shown · non-contrast
Comparison: 05/07/2016

CLINICAL DATA: Hypertension.  Routine physical exam.

EXAM:
CHEST - 2 VIEW

[chest pa]
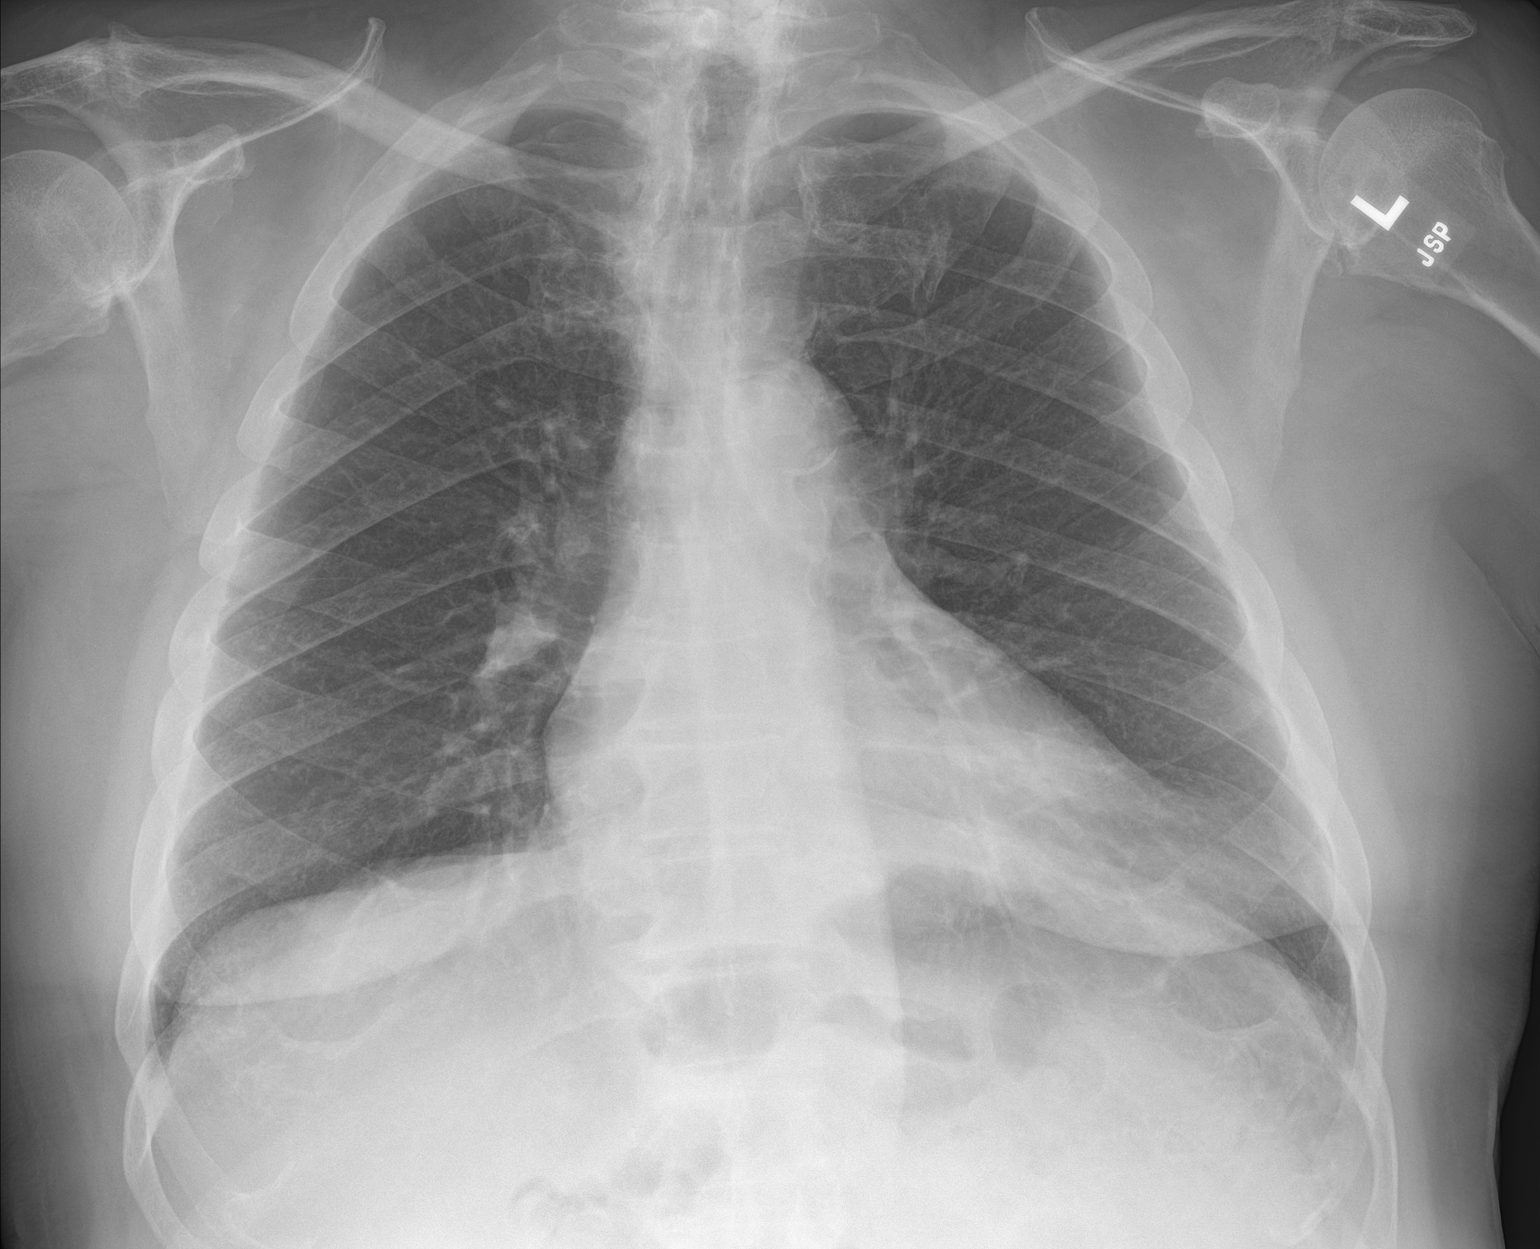

[chest lat]
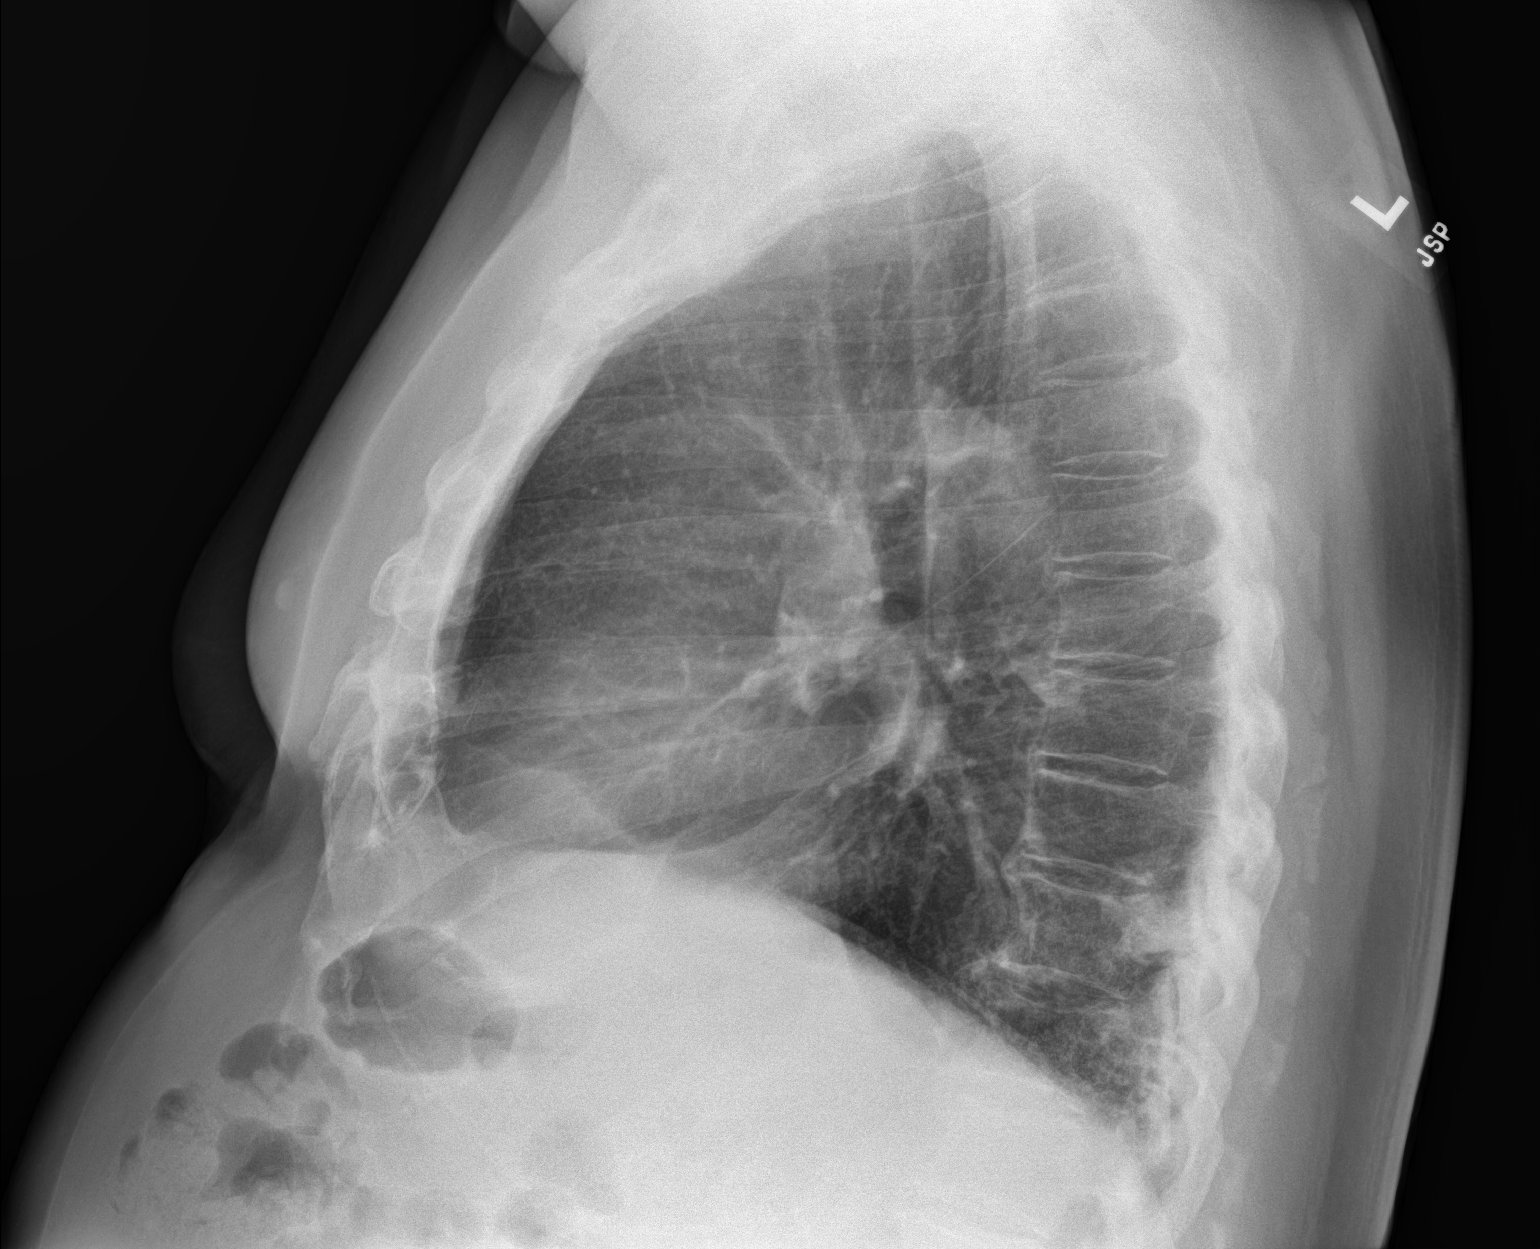

[2 of 2 positions shown; findings below may reference images not displayed]

FINDINGS: Heart size is normal. There is aortic atherosclerosis. Mild chronic
scarring at the left base is unchanged. No evidence of active
infiltrate, mass, effusion or collapse. Pulmonary vascularity is
normal. No significant bone finding.
IMPRESSION: No active disease. Mild chronic scarring at the left base. Aortic
atherosclerosis.
